# Patient Record
Sex: Female | Born: 1975 | Race: Black or African American | Hispanic: No | Marital: Married | Smoking: Never smoker
Health system: Southern US, Community
[De-identification: ages and names within clinical notes are randomized; demographics above are authoritative.]

## PROBLEM LIST (undated history)

## (undated) DIAGNOSIS — K219 Gastro-esophageal reflux disease without esophagitis: Secondary | ICD-10-CM

## (undated) DIAGNOSIS — E119 Type 2 diabetes mellitus without complications: Secondary | ICD-10-CM

## (undated) DIAGNOSIS — R011 Cardiac murmur, unspecified: Secondary | ICD-10-CM

## (undated) DIAGNOSIS — T4145XA Adverse effect of unspecified anesthetic, initial encounter: Secondary | ICD-10-CM

## (undated) DIAGNOSIS — N939 Abnormal uterine and vaginal bleeding, unspecified: Secondary | ICD-10-CM

## (undated) DIAGNOSIS — T8859XA Other complications of anesthesia, initial encounter: Secondary | ICD-10-CM

## (undated) DIAGNOSIS — D649 Anemia, unspecified: Secondary | ICD-10-CM

## (undated) DIAGNOSIS — D219 Benign neoplasm of connective and other soft tissue, unspecified: Secondary | ICD-10-CM

## (undated) DIAGNOSIS — M069 Rheumatoid arthritis, unspecified: Secondary | ICD-10-CM

## (undated) HISTORY — PX: ABDOMINAL HYSTERECTOMY: SHX81

## (undated) HISTORY — PX: BIOPSY BREAST: PRO8

## (undated) HISTORY — PX: TUBAL LIGATION: SHX77

---

## 2009-09-17 ENCOUNTER — Emergency Department (HOSPITAL_COMMUNITY): Admission: EM | Admit: 2009-09-17 | Discharge: 2009-09-17 | Payer: Self-pay | Admitting: Family Medicine

## 2010-12-16 ENCOUNTER — Emergency Department (HOSPITAL_COMMUNITY): Payer: Self-pay

## 2010-12-16 ENCOUNTER — Emergency Department (HOSPITAL_COMMUNITY)
Admission: EM | Admit: 2010-12-16 | Discharge: 2010-12-16 | Disposition: A | Discharge status: Home / Self Care | Payer: Self-pay | Attending: Emergency Medicine | Admitting: Emergency Medicine

## 2010-12-16 DIAGNOSIS — D649 Anemia, unspecified: Secondary | ICD-10-CM | POA: Insufficient documentation

## 2010-12-16 DIAGNOSIS — N898 Other specified noninflammatory disorders of vagina: Secondary | ICD-10-CM | POA: Insufficient documentation

## 2010-12-16 DIAGNOSIS — E119 Type 2 diabetes mellitus without complications: Secondary | ICD-10-CM | POA: Insufficient documentation

## 2010-12-16 LAB — DIFFERENTIAL
Basophils Absolute: 0 10*3/uL (ref 0.0–0.1)
Basophils Relative: 1 % (ref 0–1)
Lymphocytes Relative: 43 % (ref 12–46)
Neutro Abs: 3 10*3/uL (ref 1.7–7.7)
Neutrophils Relative %: 47 % (ref 43–77)

## 2010-12-16 LAB — WET PREP, GENITAL
Trich, Wet Prep: NONE SEEN
WBC, Wet Prep HPF POC: NONE SEEN

## 2010-12-16 LAB — CBC
HCT: 32.4 % — ABNORMAL LOW (ref 36.0–46.0)
Hemoglobin: 10 g/dL — ABNORMAL LOW (ref 12.0–15.0)
MCHC: 30.9 g/dL (ref 30.0–36.0)
RBC: 4.25 MIL/uL (ref 3.87–5.11)
WBC: 6.3 10*3/uL (ref 4.0–10.5)

## 2010-12-16 LAB — URINE MICROSCOPIC-ADD ON

## 2010-12-16 LAB — BASIC METABOLIC PANEL
BUN: 10 mg/dL (ref 6–23)
CO2: 26 mEq/L (ref 19–32)
Chloride: 102 mEq/L (ref 96–112)
GFR calc non Af Amer: >60 mL/min (ref >60)
Glucose, Bld: 99 mg/dL (ref 70–99)
Potassium: 3.9 mEq/L (ref 3.5–5.1)
Sodium: 137 mEq/L (ref 135–145)

## 2010-12-16 LAB — URINALYSIS, ROUTINE W REFLEX MICROSCOPIC
Bilirubin Urine: NEGATIVE
Ketones, ur: NEGATIVE mg/dL
Nitrite: NEGATIVE
Specific Gravity, Urine: 1.023 (ref 1.005–1.030)
pH: 6.5 (ref 5.0–8.0)

## 2010-12-16 LAB — POCT PREGNANCY, URINE: Preg Test, Ur: NEGATIVE

## 2010-12-17 LAB — GC/CHLAMYDIA PROBE AMP, GENITAL: Chlamydia, DNA Probe: NEGATIVE

## 2011-01-24 ENCOUNTER — Inpatient Hospital Stay (HOSPITAL_COMMUNITY)
Admission: AD | Admit: 2011-01-24 | Discharge: 2011-01-24 | Disposition: A | Discharge status: Home / Self Care | Payer: Self-pay | Source: Ambulatory Visit | Attending: Obstetrics & Gynecology | Admitting: Obstetrics & Gynecology

## 2011-01-24 ENCOUNTER — Inpatient Hospital Stay (HOSPITAL_COMMUNITY): Payer: Self-pay

## 2011-01-24 ENCOUNTER — Encounter (HOSPITAL_COMMUNITY): Payer: Self-pay | Admitting: *Deleted

## 2011-01-24 DIAGNOSIS — N949 Unspecified condition associated with female genital organs and menstrual cycle: Secondary | ICD-10-CM | POA: Insufficient documentation

## 2011-01-24 DIAGNOSIS — R9389 Abnormal findings on diagnostic imaging of other specified body structures: Secondary | ICD-10-CM | POA: Diagnosis present

## 2011-01-24 DIAGNOSIS — N938 Other specified abnormal uterine and vaginal bleeding: Secondary | ICD-10-CM | POA: Insufficient documentation

## 2011-01-24 HISTORY — DX: Anemia, unspecified: D64.9

## 2011-01-24 HISTORY — DX: Type 2 diabetes mellitus without complications: E11.9

## 2011-01-24 LAB — CBC
HCT: 29.3 % — ABNORMAL LOW (ref 36.0–46.0)
MCHC: 30.4 g/dL (ref 30.0–36.0)
Platelets: 417 10*3/uL — ABNORMAL HIGH (ref 150–400)
RDW: 16.5 % — ABNORMAL HIGH (ref 11.5–15.5)

## 2011-01-24 LAB — URINE MICROSCOPIC-ADD ON

## 2011-01-24 LAB — URINALYSIS, ROUTINE W REFLEX MICROSCOPIC
Nitrite: POSITIVE — AB
Protein, ur: 100 mg/dL — AB
Urobilinogen, UA: 1 mg/dL (ref 0.0–1.0)

## 2011-01-24 LAB — WET PREP, GENITAL: WBC, Wet Prep HPF POC: NONE SEEN

## 2011-01-24 LAB — POCT PREGNANCY, URINE: Preg Test, Ur: NEGATIVE

## 2011-01-24 MED ORDER — MEGESTROL ACETATE 40 MG PO TABS: 40.0000 mg | ORAL_TABLET | Freq: Every day | ORAL | Status: AC

## 2011-01-24 NOTE — ED Provider Notes (Signed)
History     Chief Complaint  Patient presents with  . Vaginal Bleeding   HPI Pt is not pregnant and complains of heavy vaginal bleeding since June12.  The first week it was light then has gotten heavier.  She has been passing fist size clots.  She has been wearing double pads changing 2 to 3 times daily.  She was seen at [facility] on 6/25 for initial evaluation.  Today she has been feeling more light heavier.  She had missed a period for 3 months then started bleeding and has not stopped.  She has gained weight.  She was told she was diabetic and was put on MEtformin.   Past Medical History  Diagnosis Date  . DM II (diabetes mellitus, type II), controlled   . Anemia     Past Surgical History  Procedure Date  . Tubal ligation   . Biopsy breast     benign     No family history on file.  History  Substance Use Topics  . Smoking status: Never Smoker   . Smokeless tobacco: Not on file  . Alcohol Use: No    Allergies: Allergies not on file  No prescriptions prior to admission    ROS Physical Exam   Blood pressure 158/96, pulse 103, temperature 98.6 F (37 C), temperature source Oral, resp. rate 20, height 5\' 3"  (1.6 m), weight 349 lb 12.8 oz (158.668 kg), last menstrual period 12/03/2010, SpO2 99.00%.  Physical Exam  Vitals reviewed. Constitutional: She is oriented to person, place, and time. She appears well-developed and well-nourished.       massively obese  HENT:  Head: Normocephalic.  Eyes: Pupils are equal, round, and reactive to light.  Neck: Normal range of motion. Neck supple.  Respiratory: Effort normal.  GI: Soft.  Genitourinary:       Small to mod amount of bright red blood in vault; uterus and adnexa mildly tender- size unable to appreciate due to habitus  Musculoskeletal: Normal range of motion.  Neurological: She is alert and oriented to person, place, and time.  Skin: Skin is warm and dry.  Psychiatric: She has a normal mood and affect.    MAU Course    Procedurespelvic exam with wet prep ande GC/Chlamdyia performed Ultrasound ordered- previous ultrasoundfrom June  showed endometrium 2.2cm and pt was to have re-eval in 4 to 6 weeks. MDM   Assessment and Plan    LINEBERRY,SUSAN 01/24/2011, 7:45 PM   US Transvaginal Non-ob  01/24/2011  *RADIOLOGY REPORT*  Clinical Data: Abnormal uterine bleeding  TRANSABDOMINAL AND TRANSVAGINAL ULTRASOUND OF PELVIS Technique:  Both transabdominal and transvaginal ultrasound examinations of the pelvis were performed. Transabdominal technique was performed for global imaging of the pelvis including uterus, ovaries, adnexal regions, and pelvic cul-de-sac.  Comparison: 12/16/2010  It was necessary to proceed with endovaginal exam following the transabdominal exam to better visualize the endometrium.  Findings:  Uterus: Normal in size.  2.5 x 2.1 x 1.6 cm intramural right uterine body fibroid.  Endometrium: Heterogeneous, thickened, measuring up to 2.3 cm.  No focal lesion.  Right ovary:  Not visualized.  Left ovary: Measures 4.0 x 2.2 x 2.6 cm and is notable for a 3.4 x 2.1 x 1.4 cm cyst/follicle.  Other findings: No free fluid  IMPRESSION: Heterogeneous, thickened endometrium measuring up to 2.3 cm.  No focal lesion.  Sonohysterogram or hysteroscopic evaluation should be considered.  3.4 cm left ovarian cyst / follicle.  Right ovary is not visualized.  Original  Report Authenticated By: Charline Bills, M.D.    A/P: 35 y.o. [id number] with DUB, thickened endometrium Rx megace F/U in GYN clinic Rev'd precautions Consult with Dr. Despina Hidden, agrees with plan

## 2011-01-24 NOTE — Progress Notes (Signed)
Pt states she has had vaginal bleeding every day since 6-12. Is heavy at times with fist size clots. Has some cramping off and on. Has been feeling weak today. Was seen at [facility] ED about 3 weeks ago. Was given Flagyl for bacteria.

## 2011-01-25 LAB — GC/CHLAMYDIA PROBE AMP, GENITAL: GC Probe Amp, Genital: NEGATIVE

## 2011-04-27 ENCOUNTER — Emergency Department (INDEPENDENT_AMBULATORY_CARE_PROVIDER_SITE_OTHER)
Admission: EM | Admit: 2011-04-27 | Discharge: 2011-04-27 | Disposition: A | Discharge status: Home / Self Care | Payer: Worker's Compensation | Source: Home / Self Care | Attending: Family Medicine | Admitting: Family Medicine

## 2011-04-27 ENCOUNTER — Encounter (HOSPITAL_COMMUNITY): Payer: Self-pay | Admitting: *Deleted

## 2011-04-27 DIAGNOSIS — S39012A Strain of muscle, fascia and tendon of lower back, initial encounter: Secondary | ICD-10-CM

## 2011-04-27 DIAGNOSIS — S335XXA Sprain of ligaments of lumbar spine, initial encounter: Secondary | ICD-10-CM

## 2011-04-27 DIAGNOSIS — IMO0002 Reserved for concepts with insufficient information to code with codable children: Secondary | ICD-10-CM

## 2011-04-27 DIAGNOSIS — S46911A Strain of unspecified muscle, fascia and tendon at shoulder and upper arm level, right arm, initial encounter: Secondary | ICD-10-CM

## 2011-04-27 MED ORDER — CYCLOBENZAPRINE HCL 10 MG PO TABS: 10.0000 mg | ORAL_TABLET | Freq: Three times a day (TID) | ORAL | Status: AC | PRN

## 2011-04-27 MED ORDER — HYDROCODONE-ACETAMINOPHEN 5-325 MG PO TABS: 1.0000 | ORAL_TABLET | Freq: Three times a day (TID) | ORAL | Status: AC

## 2011-04-27 NOTE — ED Provider Notes (Addendum)
History     CSN: [id number] Arrival date & time: 04/27/2011  5:53 PM   First MD Initiated Contact with Patient 04/27/11 1831      Chief Complaint  Patient presents with  . Back Pain    co pain starting today after moving patient from bed to stretcher at work, pain worse when raising right arm, co right shoulder pain and low back pain, has taken tylenol    (Consider location/radiation/quality/duration/timing/severity/associated sxs/prior treatment) Patient is a 35 y.o. female presenting with back pain. The history is provided by the patient.  Back Pain  This is a new problem. The current episode started 3 to 5 hours ago. The problem occurs constantly. The problem has been gradually worsening. 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 The pain is present in the lumbar spine. The quality of the pain is described as stabbing. The pain does not radiate. The pain is at a severity of 7/10. The symptoms are aggravated by bending and twisting. Pertinent negatives include no chest pain, no numbness, no abdominal pain, no paresthesias, no tingling and no weakness. Treatments tried: TYLENOL. The treatment provided no relief. Risk factors include obesity (DIABETES).    Past Medical History  Diagnosis Date  . DM II (diabetes mellitus, type II), controlled   . Anemia     Past Surgical History  Procedure Date  . Tubal ligation   . Biopsy breast     benign     History reviewed. No pertinent family history.  History  Substance Use Topics  . Smoking status: Never Smoker   . Smokeless tobacco: Not on file  . Alcohol Use: No    OB History    Grav Para Term Preterm Abortions TAB SAB Ect Mult Living   8 4 4  4 4    4       Review of Systems  Constitutional: Negative.   HENT: Negative.   Respiratory: Negative.   Cardiovascular: Negative.  Negative for chest pain.  Gastrointestinal: Negative.  Negative for abdominal pain.  Genitourinary: Negative.     Musculoskeletal: Positive for back pain.  Neurological: Negative for tingling, weakness, numbness and paresthesias.    Allergies  Review of patient's allergies indicates no known allergies.  Home Medications   Current Outpatient Rx  Name Route Sig Dispense Refill  . MEGESTROL ACETATE 40 MG PO TABS Oral Take 40 mg by mouth daily.      . CYCLOBENZAPRINE HCL 10 MG PO TABS Oral Take 1 tablet (10 mg total) by mouth 3 (three) times daily as needed for muscle spasms. 15 tablet 0  . HYDROCODONE-ACETAMINOPHEN 5-325 MG PO TABS Oral Take 1 tablet by mouth every 8 (eight) hours. 12 tablet 0    BP 142/66  Pulse 90  Temp(Src) 98.8 F (37.1 C) (Oral)  Resp 16  SpO2 100%  Physical Exam  Constitutional: She appears well-developed and well-nourished.       OBESE  Cardiovascular: Normal rate and regular rhythm.   Pulmonary/Chest: Breath sounds normal.  Abdominal: Soft. There is no tenderness.  Musculoskeletal:       PAIN AT THE L/S JUNCTION. PAIN WITH FLEXION TO 45 DEGREES, PAIN WITH SIDE BENDING. VERY LITTLE PAIN WITH ROTATION. HEEL TOE WALK INTACT. NEG SLR. SENSORY INTACT. DTR SYMETRICAL. NO DEFORMITY OR SWELLING. NO SKIN CHANGES. PAIN TO PALPATION RIGHT POST SHOULDER. ROM INTACT BUT PAINFUL. SKIN CLEAR, N/V INTACT DISTALLY.     ED Course  Procedures (including critical care time)  Labs Reviewed - No data to display No results found.   1. Lumbar strain   2. Right shoulder strain       MDM          Randa Spike, MD 04/27/11 1856  Randa Spike, MD 04/27/11 1857  Randa Spike, MD 04/27/11 2117

## 2011-05-19 ENCOUNTER — Encounter: Payer: Self-pay | Admitting: Advanced Practice Midwife

## 2011-07-24 ENCOUNTER — Encounter (HOSPITAL_COMMUNITY): Payer: Self-pay | Admitting: *Deleted

## 2011-07-24 ENCOUNTER — Inpatient Hospital Stay (HOSPITAL_COMMUNITY)
Admission: AD | Admit: 2011-07-24 | Discharge: 2011-07-24 | Disposition: A | Discharge status: Home / Self Care | Payer: 59 | Source: Ambulatory Visit | Attending: Obstetrics and Gynecology | Admitting: Obstetrics and Gynecology

## 2011-07-24 ENCOUNTER — Inpatient Hospital Stay (HOSPITAL_COMMUNITY): Payer: 59

## 2011-07-24 DIAGNOSIS — N926 Irregular menstruation, unspecified: Secondary | ICD-10-CM

## 2011-07-24 DIAGNOSIS — N938 Other specified abnormal uterine and vaginal bleeding: Secondary | ICD-10-CM | POA: Insufficient documentation

## 2011-07-24 DIAGNOSIS — N949 Unspecified condition associated with female genital organs and menstrual cycle: Secondary | ICD-10-CM | POA: Insufficient documentation

## 2011-07-24 LAB — WET PREP, GENITAL
Clue Cells Wet Prep HPF POC: NONE SEEN
Trich, Wet Prep: NONE SEEN
Yeast Wet Prep HPF POC: NONE SEEN

## 2011-07-24 LAB — CBC
Hemoglobin: 9.7 g/dL — ABNORMAL LOW (ref 12.0–15.0)
MCH: 22.9 pg — ABNORMAL LOW (ref 26.0–34.0)
Platelets: 410 10*3/uL — ABNORMAL HIGH (ref 150–400)
RBC: 4.24 MIL/uL (ref 3.87–5.11)
WBC: 10.7 10*3/uL — ABNORMAL HIGH (ref 4.0–10.5)

## 2011-07-24 MED ORDER — MEGESTROL ACETATE 40 MG PO TABS: 40.0000 mg | ORAL_TABLET | Freq: Every day | ORAL | Status: DC

## 2011-07-24 MED ORDER — KETOROLAC TROMETHAMINE 60 MG/2ML IM SOLN
60.0000 mg | Freq: Once | INTRAMUSCULAR | Status: AC
  Administered 2011-07-24: 60 mg via INTRAMUSCULAR
  Filled 2011-07-24: qty 2

## 2011-07-24 NOTE — Progress Notes (Addendum)
Pt in for abnormal bleeding.  Was seen here 6 months ago, given megace for bleeding.  Prescription ran out and bleeding started again on 07/09/11.  Reports passing clots from "walnut to softball size".  C/o lower abdominal cramping and pain in thighs.  Was supposed to be seen at [facility] clinic, unable to make scheduled appt.  Reports changing pad q6 hours.

## 2011-07-24 NOTE — Progress Notes (Signed)
Patient states she has had a history of irregular bleeding. States she was seen in MAU about 6 months ago and given Megace for six months. Finished the medication in early January and started bleeding on 1-16 and has continued to bleed every day, changes pads every 6 hours, and passes a lot of clots, some small and some large. Had an appointment with the [facility] but did not keep it.

## 2011-07-24 NOTE — ED Provider Notes (Signed)
History     CSN: [id number]  Arrival date & time 07/24/11  1032   None     Chief Complaint  Patient presents with  . Vaginal Bleeding  . Abdominal Pain     HPI Kaitlyn Rodgers is a very pleasant morbidly obese 36 y.o. female who presents to MAU for heavy vaginal bleeding. The bleeding started 07/09/11 and had some small clots but today passed large clots and had severe cramping pain. Was evaluated here about 6 months ago for similar problems and started on Megace. Stopped taking Megace the first week in Jan and now bleeding has returned. Nausea and headache. Was to follow up after last visit here with the GYN Clinic but had to work on the day she was scheduled so has not had follow up. The history was provided by the patient.  Past Medical History  Diagnosis Date  . DM II (diabetes mellitus, type II), controlled   . Anemia     Past Surgical History  Procedure Date  . Tubal ligation   . Biopsy breast     benign     Family History  Problem Relation Age of Onset  . Heart disease Mother     History  Substance Use Topics  . Smoking status: Never Smoker   . Smokeless tobacco: Not on file  . Alcohol Use: No    OB History    Grav Para Term Preterm Abortions TAB SAB Ect Mult Living   8 4 4  4 4    4       Review of Systems  Constitutional: Negative for fever, chills, diaphoresis and fatigue.  HENT: Negative for ear pain, congestion, sore throat, facial swelling, neck pain, neck stiffness, dental problem and sinus pressure.   Eyes: Negative for photophobia, pain and discharge.  Respiratory: Negative for cough, chest tightness and wheezing.   Cardiovascular: Negative.   Gastrointestinal: Positive for nausea and abdominal pain. Negative for vomiting, diarrhea, constipation and abdominal distention.  Genitourinary: Positive for frequency, vaginal bleeding and pelvic pain. Negative for dysuria, flank pain and difficulty urinating.  Musculoskeletal: Positive for back pain.  Negative for myalgias and gait problem.  Skin: Negative for color change and rash.  Neurological: Positive for light-headedness. Negative for dizziness, speech difficulty, weakness, numbness and headaches.  Psychiatric/Behavioral: Negative for confusion and agitation. The patient is not nervous/anxious.     Allergies  Review of patient's allergies indicates no known allergies.  Home Medications  No current outpatient prescriptions on file.  BP 129/67  Pulse 86  Temp(Src) 98.4 F (36.9 C) (Oral)  Resp 20  Ht 5\' 3"  (1.6 m)  Wt 355 lb 3.2 oz (161.118 kg)  BMI 62.92 kg/m2  SpO2 99%  LMP 07/09/2011  Physical Exam  Nursing note and vitals reviewed. Constitutional: She is oriented to person, place, and time. She appears well-developed and well-nourished.  HENT:  Head: Normocephalic.  Eyes: EOM are normal.  Neck: Neck supple.  Cardiovascular: Normal rate.   Pulmonary/Chest: Effort normal.  Abdominal: Soft. There is no tenderness.       Obese  Genitourinary:       External genitalia without lesions. Moderate blood in the vaginal vault. No CMT, mildly tender bilateral adnexa. Unable to palpate uterus due to patient habitus.  Musculoskeletal: Normal range of motion.  Neurological: She is alert and oriented to person, place, and time. No cranial nerve deficit.  Skin: Skin is warm and dry.  Psychiatric: She has a normal mood and affect. Her behavior  is normal. Judgment and thought content normal.   Results for orders placed during the hospital encounter of 07/24/11 (from the past 24 hour(s))  CBC     Status: Abnormal   Collection Time   07/24/11 12:00 PM      Component Value Range   WBC 10.7 (*) 4.0 - 10.5 (K/uL)   RBC 4.24  3.87 - 5.11 (MIL/uL)   Hemoglobin 9.7 (*) 12.0 - 15.0 (g/dL)   HCT 66.4 (*) 40.3 - 46.0 (%)   MCV 77.1 (*) 78.0 - 100.0 (fL)   MCH 22.9 (*) 26.0 - 34.0 (pg)   MCHC 29.7 (*) 30.0 - 36.0 (g/dL)   RDW 47.4 (*) 25.9 - 15.5 (%)   Platelets 410 (*) 150 - 400  (K/uL)  WET PREP, GENITAL     Status: Normal   Collection Time   07/24/11 12:42 PM      Component Value Range   Yeast Wet Prep HPF POC NONE SEEN  NONE SEEN    Trich, Wet Prep NONE SEEN  NONE SEEN    Clue Cells Wet Prep HPF POC NONE SEEN  NONE SEEN    WBC, Wet Prep HPF POC NONE SEEN  NONE SEEN    US Transvaginal Non-ob  07/24/2011  *RADIOLOGY REPORT*  Clinical Data: Abnormal bleeding with passage of large clots. Lower abdominal pain.  LMP 07/09/2011  TRANSABDOMINAL AND TRANSVAGINAL ULTRASOUND OF PELVIS Technique:  Both transabdominal and transvaginal ultrasound examinations of the pelvis were performed. Transabdominal technique was performed for global imaging of the pelvis including uterus, ovaries, adnexal regions, and pelvic cul-de-sac.  Comparison: 01/24/2011   It was necessary to proceed with endovaginal exam following the transabdominal exam to visualize the endometrium and myometrium.  Findings:  Uterus: Demonstrates a sagittal length of 12.2 cm, depth of 7.3 cm and transverse width of 8.5 cm.  Several focal fibroids are identified: one in the right anterior lower uterine segment measuring 3.0 x 3.5 x 3.0 cm and one in the left lateral lower uterine segment measuring 1.8 by 2.0 x 1.6 cm which has a partial subserosal component.  Endometrium: Is homogeneously echogenic with an AP width of 9.4 mm. No areas of focal thickening or heterogeneity are seen.  Right ovary:  Has a normal appearance measuring 2.5 x 1.4 x 2.1 cm  Left ovary: Has a normal appearance measuring 2.8 x 1.9 x 2.0 cm  Other findings: No pelvic fluid or separate adnexal masses are seen.  IMPRESSION: Fibroid uterus with fibroid sizes and locations as noted above. Normal endometrial lining and ovaries.  Original Report Authenticated By: Bertha Stakes, M.D.   US Pelvis Complete  07/24/2011  *RADIOLOGY REPORT*  Clinical Data: Abnormal bleeding with passage of large clots. Lower abdominal pain.  LMP 07/09/2011  TRANSABDOMINAL AND  TRANSVAGINAL ULTRASOUND OF PELVIS Technique:  Both transabdominal and transvaginal ultrasound examinations of the pelvis were performed. Transabdominal technique was performed for global imaging of the pelvis including uterus, ovaries, adnexal regions, and pelvic cul-de-sac.  Comparison: 01/24/2011   It was necessary to proceed with endovaginal exam following the transabdominal exam to visualize the endometrium and myometrium.  Findings:  Uterus: Demonstrates a sagittal length of 12.2 cm, depth of 7.3 cm and transverse width of 8.5 cm.  Several focal fibroids are identified: one in the right anterior lower uterine segment measuring 3.0 x 3.5 x 3.0 cm and one in the left lateral lower uterine segment measuring 1.8 by 2.0 x 1.6 cm which has a partial subserosal component.  Endometrium: Is homogeneously echogenic with an AP width of 9.4 mm. No areas of focal thickening or heterogeneity are seen.  Right ovary:  Has a normal appearance measuring 2.5 x 1.4 x 2.1 cm  Left ovary: Has a normal appearance measuring 2.8 x 1.9 x 2.0 cm  Other findings: No pelvic fluid or separate adnexal masses are seen.  IMPRESSION: Fibroid uterus with fibroid sizes and locations as noted above. Normal endometrial lining and ovaries.  Original Report Authenticated By: Bertha Stakes, M.D.    Assessment: Abnormal vaginal bleeding  Plan:  Megace 40 mg po daily Rx  ED Course: discussed with Dr. Jolayne Panther and will have patient start back on Megace and follow up in the  GYN clinic. Stressed importance to patient that Megace is only temporary and she will need follow up.  Procedures   MDM          Kerrie Buffalo, NP 07/24/11 1523

## 2011-07-25 LAB — GC/CHLAMYDIA PROBE AMP, GENITAL
Chlamydia, DNA Probe: NEGATIVE
GC Probe Amp, Genital: NEGATIVE

## 2011-07-25 NOTE — ED Provider Notes (Signed)
Agree with above note.  Bhumi Godbey 07/25/2011 5:50 AM   

## 2011-07-31 ENCOUNTER — Encounter: Payer: Self-pay | Admitting: Obstetrics & Gynecology

## 2011-07-31 ENCOUNTER — Ambulatory Visit (INDEPENDENT_AMBULATORY_CARE_PROVIDER_SITE_OTHER): Payer: 59 | Admitting: Advanced Practice Midwife

## 2011-07-31 ENCOUNTER — Encounter: Payer: Self-pay | Admitting: Advanced Practice Midwife

## 2011-07-31 ENCOUNTER — Other Ambulatory Visit (HOSPITAL_COMMUNITY)
Admission: RE | Admit: 2011-07-31 | Discharge: 2011-07-31 | Disposition: A | Discharge status: Home / Self Care | Payer: 59 | Source: Ambulatory Visit | Attending: Advanced Practice Midwife | Admitting: Advanced Practice Midwife

## 2011-07-31 DIAGNOSIS — N920 Excessive and frequent menstruation with regular cycle: Secondary | ICD-10-CM

## 2011-07-31 DIAGNOSIS — Z01812 Encounter for preprocedural laboratory examination: Secondary | ICD-10-CM

## 2011-07-31 DIAGNOSIS — D259 Leiomyoma of uterus, unspecified: Secondary | ICD-10-CM

## 2011-07-31 NOTE — Progress Notes (Signed)
  Endometrial Biopsy Procedure Note  Pre-operative Diagnosis: Menorrhagia, Fibroid Uterus  Post-operative Diagnosis: same  Indications: abnormal uterine bleeding  Procedure Details   Urine pregnancy test was done  and result was Negative.  The risks (including infection, bleeding, pain, and uterine perforation) and benefits of the procedure were explained to the patient and Verbal informed consent was obtained.  Antibiotic prophylaxis against endocarditis was not indicated.   The patient was placed in the dorsal lithotomy position.  Bimanual exam showed the uterus to be in the neutral position.  A Graves' speculum inserted in the vagina, and the cervix prepped with povidone iodine.  Endocervical curettage with a Kevorkian curette was performed. Cervix was difficult to locate due to habitus. Several methods were used and eventually successfully found it using a Long Graves.   A sharp tenaculum was applied to the anterior lip of the cervix for stabilization.  A sterile uterine sound was used to sound the uterus to a depth of 4cm.  A Pipelle endometrial aspirator was used to sample the endometrium.  Sample was sent for pathologic examination.  Condition: Stable  Complications: None  Plan:  The patient was advised to call for any fever or for prolonged or severe pain or bleeding. She was advised to use OTC ibuprofen as needed for mild to moderate pain. She was advised to avoid vaginal intercourse for 48 hours or until the bleeding has completely stopped.  We will bring her back in 2-3 weeks with MD to discuss options for treatment. I discussed hormonal options (patient adamantly refuses long term hormonal treatment), ablation and hysterectomy as last resort after other treatments failed.  Patient is very certain she wants a hysterectomy. I told her I cannot say for sure this would be recommended as first line therapy, so will have her consult with a doctor.

## 2011-07-31 NOTE — Patient Instructions (Signed)
Menorrhagia Dysfunctional uterine bleeding is different from a normal menstrual period. When periods are heavy or there is more bleeding than is usual for you, it is called menorrhagia. It may be caused by hormonal imbalance, or physical, metabolic, or other problems. Examination is necessary in order that your caregiver may treat treatable causes. If this is a continuing problem, a D&C may be needed. That means that the cervix (the opening of the uterus or womb) is dilated (stretched larger) and the lining of the uterus is scraped out. The tissue scraped out is then examined under a microscope by a specialist (pathologist) to make sure there is nothing of concern that needs further or more extensive treatment. HOME CARE INSTRUCTIONS   If medications were prescribed, take exactly as directed. Do not change or switch medications without consulting your caregiver.   Long term heavy bleeding may result in iron deficiency. Your caregiver may have prescribed iron pills. They help replace the iron your body lost from heavy bleeding. Take exactly as directed. Iron may cause constipation. If this becomes a problem, increase the bran, fruits, and roughage in your diet.   Do not take aspirin or medicines that contain aspirin one week before or during your menstrual period. Aspirin may make the bleeding worse.   If you need to change your sanitary pad or tampon more than once every 2 hours, stay in bed and rest as much as possible until the bleeding stops.   Eat well-balanced meals. Eat foods high in iron. Examples are leafy green vegetables, meat, liver, eggs, and whole grain breads and cereals. Do not try to lose weight until the abnormal bleeding has stopped and your blood iron level is back to normal.  SEEK MEDICAL CARE IF:   You need to change your sanitary pad or tampon more than once an hour.   You develop nausea (feeling sick to your stomach) and vomiting, dizziness, or diarrhea while you are taking  your medicine.   You have any problems that may be related to the medicine you are taking.  SEEK IMMEDIATE MEDICAL CARE IF:   You have a fever.   You develop chills.   You develop severe bleeding or start to pass blood clots.   You feel dizzy or faint.  MAKE SURE YOU:   Understand these instructions.   Will watch your condition.   Will get help right away if you are not doing well or get worse.  Document Released: 06/09/2005 Document Revised: 02/19/2011 Document Reviewed: 01/28/2008 [facility] Patient Information 2012 [place], [place].Endometrial Biopsy This is a test in which a tissue sample (a biopsy) is taken from inside the uterus (womb). It is then looked at by a specialist under a microscope to see if the tissue is normal or abnormal. The endometrium is the lining of the uterus. This test helps determine where you are in your menstrual cycle and how hormone levels are affecting the lining of the uterus. Another use for this test is to diagnose endometrial cancer, tuberculosis, polyps, or inflammatory conditions and to evaluate uterine bleeding. PREPARATION FOR TEST No preparation or fasting is necessary. NORMAL FINDINGS No pathologic conditions. Presence of "secretory-type" endometrium 3 to 5 days before to normal menstruation. Ranges for normal findings may vary among different laboratories and hospitals. You should always check with your doctor after having lab work or other tests done to discuss the meaning of your test results and whether your values are considered within normal limits. MEANING OF TEST  Your caregiver  will go over the test results with you and discuss the importance and meaning of your results, as well as treatment options and the need for additional tests if necessary. OBTAINING THE TEST RESULTS It is your responsibility to obtain your test results. Ask the lab or department performing the test when and how you will get your results. Document Released:  10/10/2004 Document Revised: 02/19/2011 Document Reviewed: 05/19/2008 [facility] Patient Information 2012 [place], [place].

## 2011-08-22 ENCOUNTER — Ambulatory Visit (INDEPENDENT_AMBULATORY_CARE_PROVIDER_SITE_OTHER): Payer: 59 | Admitting: Obstetrics & Gynecology

## 2011-08-22 DIAGNOSIS — N920 Excessive and frequent menstruation with regular cycle: Secondary | ICD-10-CM

## 2011-08-22 DIAGNOSIS — Z0000 Encounter for general adult medical examination without abnormal findings: Secondary | ICD-10-CM

## 2011-08-22 DIAGNOSIS — Z23 Encounter for immunization: Secondary | ICD-10-CM

## 2011-08-22 MED ORDER — INFLUENZA VIRUS VACC SPLIT PF IM SUSP
0.5000 mL | INTRAMUSCULAR | Status: AC
  Administered 2011-08-22: 0.5 mL via INTRAMUSCULAR

## 2011-08-22 MED ORDER — MEGESTROL ACETATE 40 MG PO TABS: 40.0000 mg | ORAL_TABLET | Freq: Every day | ORAL | Status: AC

## 2011-08-22 NOTE — Progress Notes (Signed)
  Subjective:    Patient ID: Kaitlyn Rodgers, female    DOB: December 08, 1975, 36 y.o.   MRN: [id number]  HPI  Ms. Adah Salvage is a 36 yo very pleasant morbidly obese AA lady who would like a hysterectomy ASAP. She has a large fibroid uterus and menorrhagia resulting in anemia. She is on Megace to try to help the bleeding, but she is still bleeding. She has been experiencing dysparunia recently as well.  Review of Systems She wants a flu shot today, Her pap is due    Objective:   Physical Exam  14 week size uterus, mobile, good pubic arch Pap smear done, normal appearing cervix      Assessment & Plan:  Fibroids, menorrhagia- I have given her written info on RATH and this is the route she would like. I have discussed risks of surgery.

## 2011-08-23 LAB — TSH: TSH: 2.54 u[IU]/mL (ref 0.350–4.500)

## 2011-09-03 ENCOUNTER — Encounter (HOSPITAL_COMMUNITY): Payer: Self-pay | Admitting: Emergency Medicine

## 2011-09-03 ENCOUNTER — Emergency Department (INDEPENDENT_AMBULATORY_CARE_PROVIDER_SITE_OTHER)
Admission: EM | Admit: 2011-09-03 | Discharge: 2011-09-03 | Disposition: A | Discharge status: Home Health | Payer: 59 | Source: Home / Self Care | Attending: Emergency Medicine | Admitting: Emergency Medicine

## 2011-09-03 DIAGNOSIS — J329 Chronic sinusitis, unspecified: Secondary | ICD-10-CM

## 2011-09-03 DIAGNOSIS — J683 Other acute and subacute respiratory conditions due to chemicals, gases, fumes and vapors: Secondary | ICD-10-CM

## 2011-09-03 DIAGNOSIS — J45909 Unspecified asthma, uncomplicated: Secondary | ICD-10-CM

## 2011-09-03 HISTORY — DX: Abnormal uterine and vaginal bleeding, unspecified: N93.9

## 2011-09-03 HISTORY — DX: Benign neoplasm of connective and other soft tissue, unspecified: D21.9

## 2011-09-03 MED ORDER — DOXYCYCLINE HYCLATE 100 MG PO CAPS: 100.0000 mg | ORAL_CAPSULE | Freq: Two times a day (BID) | ORAL | Status: AC

## 2011-09-03 MED ORDER — HYDROCODONE-ACETAMINOPHEN 7.5-500 MG/15ML PO SOLN: 5.0000 mL | Freq: Four times a day (QID) | ORAL | Status: AC | PRN

## 2011-09-03 MED ORDER — PSEUDOEPHEDRINE-GUAIFENESIN ER 120-1200 MG PO TB12: 1.0000 | ORAL_TABLET | Freq: Two times a day (BID) | ORAL | Status: DC | PRN

## 2011-09-03 MED ORDER — FLUTICASONE PROPIONATE 50 MCG/ACT NA SUSP: 2.0000 | Freq: Every day | NASAL | Status: AC

## 2011-09-03 MED ORDER — ALBUTEROL SULFATE HFA 108 (90 BASE) MCG/ACT IN AERS: 1.0000 | INHALATION_SPRAY | Freq: Four times a day (QID) | RESPIRATORY_TRACT | Status: DC | PRN

## 2011-09-03 NOTE — ED Notes (Addendum)
PT HERE WITH SINUS SX THAT STARTED LAST WEEK WITH CONGESTION,SNEEZING AND HEAD PRESSURE ON BOTH SIDES BUT HAS WORSENED YESTERDAY WITH CHEST PRESSURE AND PAIN DEEP BREATHING,SOB AND FATIGUE.TEMP OVER THE WEEKEND 102.3.PT HAS BEEN TAKING OTC COLD MEDS AND MUCINEX.

## 2011-09-03 NOTE — ED Provider Notes (Signed)
History     CSN: [id number]  Arrival date & time 09/03/11  9528   First MD Initiated Contact with Patient 09/03/11 0902      Chief Complaint  Patient presents with  . Sinusitis  . URI    (Consider location/radiation/quality/duration/timing/severity/associated sxs/prior treatment) HPI Comments: Pt with nasal congestion, postnasal drip, ST, nonproductive cough, bilateral frontal sinus pain/pressure worse with bending forward  x 7 days. Reports fevers Tmax 102.3, fatigue. Reports some chest tightness, mild shortness of breath when talking or with exertion. No chest pain, wheezing. Is unable to sleep at night because of all the coughing. No ear pain, N/V, other HA, bodyaches,dental pain, purulent nasal d/c. Taking otc cold meds including Mucinex DM w/o relief. No known sick contacts.  has a history of sinus infections. No history of asthma, COPD, pneumonia, CAD. Patient states that her glucose has been running within normal limits for her.  ROS as noted in HPI. All other ROS negative.   Patient is a 36 y.o. female presenting with sinusitis. The history is provided by the patient. No language interpreter was used.  Sinusitis  This is a new problem. The current episode started more than 2 days ago. The problem has been gradually worsening. The maximum temperature recorded prior to her arrival was 102 to 102.9 F. The fever has been present for 1 to 2 days. Associated symptoms include congestion and sinus pressure. She has tried other medications for the symptoms.    Past Medical History  Diagnosis Date  . DM II (diabetes mellitus, type II), controlled   . Anemia   . Abnormal vaginal bleeding   . Fibroids     Past Surgical History  Procedure Date  . Tubal ligation   . Biopsy breast     benign     Family History  Problem Relation Age of Onset  . Heart disease Mother   . Diabetes Maternal Grandmother     History  Substance Use Topics  . Smoking status: Never Smoker   .  Smokeless tobacco: Never Used  . Alcohol Use: No    OB History    Grav Para Term Preterm Abortions TAB SAB Ect Mult Living   5 4 4  1 1    4       Review of Systems  HENT: Positive for congestion and sinus pressure.     Allergies  Review of patient's allergies indicates no known allergies.  Home Medications   Current Outpatient Rx  Name Route Sig Dispense Refill  . ALBUTEROL SULFATE HFA 108 (90 BASE) MCG/ACT IN AERS Inhalation Inhale 1-2 puffs into the lungs every 6 (six) hours as needed for wheezing. 1 Inhaler 0  . DOXYCYCLINE HYCLATE 100 MG PO CAPS Oral Take 1 capsule (100 mg total) by mouth 2 (two) times daily. X 7 days 14 capsule 0  . OMEGA-3 FATTY ACIDS 1000 MG PO CAPS Oral Take 2 g by mouth daily.    Marland Kitchen FLUTICASONE PROPIONATE 50 MCG/ACT NA SUSP Nasal Place 2 sprays into the nose daily. 16 g 2  . HYDROCODONE-ACETAMINOPHEN 7.5-500 MG/15ML PO SOLN Oral Take 5 mLs by mouth every 6 (six) hours as needed for pain. 120 mL 0  . MEGESTROL ACETATE 40 MG PO TABS Oral Take 1 tablet (40 mg total) by mouth daily. 30 tablet 2  . METFORMIN HCL 500 MG PO TABS Oral Take 500 mg by mouth daily.    . MULTI-VITAMIN/MINERALS PO TABS Oral Take 1 tablet by mouth daily.    Marland Kitchen  PSEUDOEPHEDRINE-GUAIFENESIN ER [id number] MG PO TB12 Oral Take 1 tablet by mouth 2 (two) times daily as needed (congestion). 20 each 0    BP 127/68  Pulse 87  Temp(Src) 98.3 F (36.8 C) (Oral)  SpO2 97%  LMP 07/05/2011  Physical Exam  Nursing note and vitals reviewed. Constitutional: She is oriented to person, place, and time. She appears well-developed and well-nourished.  HENT:  Head: Normocephalic and atraumatic.  Right Ear: Tympanic membrane and ear canal normal.  Left Ear: Tympanic membrane and ear canal normal.  Nose: Mucosal edema and rhinorrhea present. No epistaxis.  Mouth/Throat: Uvula is midline and mucous membranes are normal. Posterior oropharyngeal erythema present. No oropharyngeal exudate.       Purulent  nasal discharge. (-) frontal, maxillary sinus tenderness  Eyes: Conjunctivae and EOM are normal. Pupils are equal, round, and reactive to light.  Neck: Normal range of motion. Neck supple.  Cardiovascular: Normal rate, regular rhythm and normal heart sounds.   Pulmonary/Chest: Effort normal and breath sounds normal.  Abdominal: Soft. Bowel sounds are normal.  Musculoskeletal: Normal range of motion.  Lymphadenopathy:    She has no cervical adenopathy.  Neurological: She is alert and oriented to person, place, and time.  Skin: Skin is warm and dry. No rash noted.  Psychiatric: She has a normal mood and affect. Her behavior is normal. Judgment and thought content normal.    ED Course  Procedures (including critical care time)  Labs Reviewed - No data to display No results found.   1. Sinusitis   2. Reactive airways dysfunction syndrome     MDM  Pt with fever 102.3, Will start  doxycycline in addition to flonase, mucinex-d, saline nasal irrigation, increase fluids, tylenol/motrin prn pain. Doxycycline will also cover any possible pneumonia, however, patient's lungs are clear and is satting 97% room-air. Discussed MDM and plan with pt. Pt agrees with plan and will f/u with PMD prn.    Domenick Gong, MD 09/03/11 1650

## 2011-09-03 NOTE — Discharge Instructions (Signed)
Take two puffs from your inhaler every 4-6 hours for the first day, then as needed. Take the medication as written You may take 600 mg of motrin with 1 gram of tylenol up to 4 times a day as needed for pain. This is an effective combination for pain.  Most sinus infections are viral and do not need antibiotics unless you have a high fever, have had this for 10 day, or you get better and then get sick again. Use a neti pot or the NeilMed sinus rinse as often as you want to to reduce nasal congestion. Follow the directions on the box. . Return if you get worse, have a fever >100.4, or for any concerns.  Go to www.goodrx.com to look up your medications. This will give you a list of where you can find your prescriptions at the most affordable prices.

## 2011-09-16 ENCOUNTER — Encounter: Payer: Self-pay | Admitting: Obstetrics & Gynecology

## 2011-10-06 ENCOUNTER — Encounter (HOSPITAL_COMMUNITY)
Admission: RE | Admit: 2011-10-06 | Discharge: 2011-10-06 | Disposition: A | Discharge status: Home / Self Care | Payer: 59 | Source: Ambulatory Visit | Attending: Obstetrics & Gynecology | Admitting: Obstetrics & Gynecology

## 2011-10-06 ENCOUNTER — Encounter (HOSPITAL_COMMUNITY): Payer: Self-pay

## 2011-10-06 HISTORY — DX: Cardiac murmur, unspecified: R01.1

## 2011-10-06 HISTORY — DX: Adverse effect of unspecified anesthetic, initial encounter: T41.45XA

## 2011-10-06 HISTORY — DX: Gastro-esophageal reflux disease without esophagitis: K21.9

## 2011-10-06 HISTORY — DX: Other complications of anesthesia, initial encounter: T88.59XA

## 2011-10-06 LAB — SURGICAL PCR SCREEN: Staphylococcus aureus: NEGATIVE

## 2011-10-06 LAB — BASIC METABOLIC PANEL
BUN: 11 mg/dL (ref 6–23)
CO2: 25 mEq/L (ref 19–32)
Calcium: 9.9 mg/dL (ref 8.4–10.5)
Chloride: 103 mEq/L (ref 96–112)
Creatinine, Ser: 0.61 mg/dL (ref 0.50–1.10)
Glucose, Bld: 114 mg/dL — ABNORMAL HIGH (ref 70–99)

## 2011-10-06 LAB — CBC
HCT: 35.5 % — ABNORMAL LOW (ref 36.0–46.0)
Hemoglobin: 10.6 g/dL — ABNORMAL LOW (ref 12.0–15.0)
MCH: 23.4 pg — ABNORMAL LOW (ref 26.0–34.0)
MCHC: 29.9 g/dL — ABNORMAL LOW (ref 30.0–36.0)
MCV: 78.4 fL (ref 78.0–100.0)
RDW: 16.4 % — ABNORMAL HIGH (ref 11.5–15.5)

## 2011-10-06 NOTE — Patient Instructions (Addendum)
   Your procedure is scheduled on: Monday April 22nd  Enter through the Main Entrance of [facility] at:11:30am Pick up the phone at the desk and dial [phone] and inform us of your arrival.  Please call this number if you have any problems the morning of surgery: [phone]  Remember: Do not eat food after midnight: Sunday Do not drink clear liquids after:9am Monday Take these medicines the morning of surgery with a SIP OF WATER: none. Hold metformin morning of surgery. Bring your albuterol inhaler day of surgery  Do not wear jewelry, make-up, or FINGER nail polish Do not wear lotions, powders, perfumes or deodorant. Do not shave 48 hours prior to surgery. Do not bring valuables to the hospital. Contacts, dentures or bridgework may not be worn into surgery.  Leave suitcase in the car. After Surgery it may be brought to your room. For patients being admitted to the hospital, checkout time is 11:00am the day of discharge.  Patients discharged on the day of surgery will not be allowed to drive home.     Remember to use your hibiclens as instructed.Please shower with 1/2 bottle the evening before your surgery and the other 1/2 bottle the morning of surgery. Neck down avoiding private area.

## 2011-10-12 MED ORDER — CEFAZOLIN SODIUM-DEXTROSE 2-3 GM-% IV SOLR
2.0000 g | INTRAVENOUS | Status: AC
  Administered 2011-10-13: 2 g via INTRAVENOUS
  Filled 2011-10-12: qty 50

## 2011-10-13 ENCOUNTER — Encounter (HOSPITAL_COMMUNITY): Payer: Self-pay | Admitting: Certified Registered Nurse Anesthetist

## 2011-10-13 ENCOUNTER — Ambulatory Visit (HOSPITAL_COMMUNITY)
Admission: AD | Admit: 2011-10-13 | Discharge: 2011-10-13 | Disposition: A | Discharge status: Home / Self Care | Payer: 59 | Source: Ambulatory Visit | Attending: Obstetrics & Gynecology | Admitting: Obstetrics & Gynecology

## 2011-10-13 ENCOUNTER — Encounter (HOSPITAL_COMMUNITY)
Admission: AD | Disposition: A | Discharge status: Home / Self Care | Payer: Self-pay | Source: Ambulatory Visit | Attending: Obstetrics & Gynecology

## 2011-10-13 ENCOUNTER — Encounter (HOSPITAL_COMMUNITY): Payer: Self-pay | Admitting: Obstetrics & Gynecology

## 2011-10-13 ENCOUNTER — Ambulatory Visit (HOSPITAL_COMMUNITY): Payer: 59 | Admitting: Certified Registered Nurse Anesthetist

## 2011-10-13 DIAGNOSIS — N938 Other specified abnormal uterine and vaginal bleeding: Secondary | ICD-10-CM

## 2011-10-13 DIAGNOSIS — N920 Excessive and frequent menstruation with regular cycle: Secondary | ICD-10-CM

## 2011-10-13 DIAGNOSIS — D500 Iron deficiency anemia secondary to blood loss (chronic): Secondary | ICD-10-CM | POA: Insufficient documentation

## 2011-10-13 DIAGNOSIS — D259 Leiomyoma of uterus, unspecified: Secondary | ICD-10-CM

## 2011-10-13 HISTORY — PX: CYSTOSCOPY: SHX5120

## 2011-10-13 LAB — GLUCOSE, CAPILLARY
Glucose-Capillary: 119 mg/dL — ABNORMAL HIGH (ref 70–99)
Glucose-Capillary: 176 mg/dL — ABNORMAL HIGH (ref 70–99)

## 2011-10-13 LAB — PREGNANCY, URINE: Preg Test, Ur: NEGATIVE

## 2011-10-13 SURGERY — ROBOTIC ASSISTED TOTAL HYSTERECTOMY
Anesthesia: General | Site: Vagina | Wound class: Clean Contaminated

## 2011-10-13 MED ORDER — HYDROMORPHONE HCL PF 1 MG/ML IJ SOLN
0.2000 mg | INTRAMUSCULAR | Status: DC | PRN
  Administered 2011-10-13: 0.5 mg via INTRAVENOUS
  Filled 2011-10-13: qty 1

## 2011-10-13 MED ORDER — LACTATED RINGERS IR SOLN
Status: DC | PRN
  Administered 2011-10-13: 3000 mL

## 2011-10-13 MED ORDER — MEPERIDINE HCL 25 MG/ML IJ SOLN: 6.2500 mg | INTRAMUSCULAR | Status: DC | PRN

## 2011-10-13 MED ORDER — ONDANSETRON HCL 4 MG/2ML IJ SOLN
INTRAMUSCULAR | Status: DC | PRN
  Administered 2011-10-13 (×2): 2 mg via INTRAVENOUS

## 2011-10-13 MED ORDER — LACTATED RINGERS IV SOLN
INTRAVENOUS | Status: DC
  Administered 2011-10-13 (×2): via INTRAVENOUS

## 2011-10-13 MED ORDER — DEXTROSE IN LACTATED RINGERS 5 % IV SOLN: INTRAVENOUS | Status: DC

## 2011-10-13 MED ORDER — IBUPROFEN 800 MG PO TABS
800.0000 mg | ORAL_TABLET | Freq: Three times a day (TID) | ORAL | Status: DC | PRN
  Administered 2011-10-13: 800 mg via ORAL
  Filled 2011-10-13: qty 1

## 2011-10-13 MED ORDER — ONDANSETRON HCL 4 MG/2ML IJ SOLN
INTRAMUSCULAR | Status: AC
  Filled 2011-10-13: qty 2

## 2011-10-13 MED ORDER — PHENAZOPYRIDINE HCL 200 MG PO TABS
200.0000 mg | ORAL_TABLET | Freq: Once | ORAL | Status: AC
  Administered 2011-10-13: 200 mg via ORAL
  Filled 2011-10-13: qty 1

## 2011-10-13 MED ORDER — PNEUMOCOCCAL VAC POLYVALENT 25 MCG/0.5ML IJ INJ
0.5000 mL | INJECTION | INTRAMUSCULAR | Status: AC | PRN
  Administered 2011-10-13: 0.5 mL via INTRAMUSCULAR
  Filled 2011-10-13: qty 0.5

## 2011-10-13 MED ORDER — FENTANYL CITRATE 0.05 MG/ML IJ SOLN
25.0000 ug | INTRAMUSCULAR | Status: DC | PRN
  Administered 2011-10-13 (×2): 50 ug via INTRAVENOUS

## 2011-10-13 MED ORDER — ROCURONIUM BROMIDE 50 MG/5ML IV SOLN
INTRAVENOUS | Status: AC
  Filled 2011-10-13: qty 2

## 2011-10-13 MED ORDER — SCOPOLAMINE 1 MG/3DAYS TD PT72
MEDICATED_PATCH | TRANSDERMAL | Status: AC
  Administered 2011-10-13: 1.5 mg via TRANSDERMAL
  Filled 2011-10-13: qty 1

## 2011-10-13 MED ORDER — MIDAZOLAM HCL 5 MG/5ML IJ SOLN
INTRAMUSCULAR | Status: DC | PRN
  Administered 2011-10-13: .5 mg via INTRAVENOUS
  Administered 2011-10-13: 1.5 mg via INTRAVENOUS

## 2011-10-13 MED ORDER — NEOSTIGMINE METHYLSULFATE 1 MG/ML IJ SOLN
INTRAMUSCULAR | Status: AC
  Filled 2011-10-13: qty 10

## 2011-10-13 MED ORDER — LIDOCAINE HCL (CARDIAC) 20 MG/ML IV SOLN
INTRAVENOUS | Status: DC | PRN
  Administered 2011-10-13: 100 mg via INTRAVENOUS

## 2011-10-13 MED ORDER — PROPOFOL 10 MG/ML IV EMUL
INTRAVENOUS | Status: DC | PRN
  Administered 2011-10-13: 300 mg via INTRAVENOUS

## 2011-10-13 MED ORDER — INDIGOTINDISULFONATE SODIUM 8 MG/ML IJ SOLN
INTRAMUSCULAR | Status: DC | PRN
  Administered 2011-10-13: 40 mg via INTRAVENOUS

## 2011-10-13 MED ORDER — LIDOCAINE HCL (CARDIAC) 20 MG/ML IV SOLN
INTRAVENOUS | Status: AC
  Filled 2011-10-13: qty 5

## 2011-10-13 MED ORDER — NEOSTIGMINE METHYLSULFATE 1 MG/ML IJ SOLN
INTRAMUSCULAR | Status: DC | PRN
  Administered 2011-10-13: 5 mg via INTRAVENOUS

## 2011-10-13 MED ORDER — FENTANYL CITRATE 0.05 MG/ML IJ SOLN
INTRAMUSCULAR | Status: AC
  Administered 2011-10-13: 50 ug via INTRAVENOUS
  Filled 2011-10-13: qty 2

## 2011-10-13 MED ORDER — SODIUM CHLORIDE 0.9 % IJ SOLN
INTRAMUSCULAR | Status: DC | PRN
  Administered 2011-10-13: 60 mL via INTRAVENOUS

## 2011-10-13 MED ORDER — SUCCINYLCHOLINE CHLORIDE 20 MG/ML IJ SOLN
INTRAMUSCULAR | Status: DC | PRN
  Administered 2011-10-13: 140 mg via INTRAVENOUS

## 2011-10-13 MED ORDER — SCOPOLAMINE 1 MG/3DAYS TD PT72
1.0000 | MEDICATED_PATCH | Freq: Once | TRANSDERMAL | Status: AC
  Administered 2011-10-13: 1.5 mg via TRANSDERMAL
  Administered 2011-10-13: 1 via TRANSDERMAL

## 2011-10-13 MED ORDER — SIMETHICONE 80 MG PO CHEW: 80.0000 mg | CHEWABLE_TABLET | Freq: Four times a day (QID) | ORAL | Status: DC | PRN

## 2011-10-13 MED ORDER — OXYCODONE-ACETAMINOPHEN 5-325 MG PO TABS: 1.0000 | ORAL_TABLET | ORAL | Status: AC | PRN

## 2011-10-13 MED ORDER — IBUPROFEN 800 MG PO TABS: 800.0000 mg | ORAL_TABLET | Freq: Three times a day (TID) | ORAL | Status: AC | PRN

## 2011-10-13 MED ORDER — DEXAMETHASONE SODIUM PHOSPHATE 10 MG/ML IJ SOLN
INTRAMUSCULAR | Status: AC
  Filled 2011-10-13: qty 1

## 2011-10-13 MED ORDER — ROCURONIUM BROMIDE 100 MG/10ML IV SOLN
INTRAVENOUS | Status: DC | PRN
  Administered 2011-10-13: 50 mg via INTRAVENOUS
  Administered 2011-10-13: 20 mg via INTRAVENOUS

## 2011-10-13 MED ORDER — ACETAMINOPHEN 10 MG/ML IV SOLN
1000.0000 mg | Freq: Once | INTRAVENOUS | Status: AC
  Administered 2011-10-13: 1000 mg via INTRAVENOUS
  Filled 2011-10-13: qty 100

## 2011-10-13 MED ORDER — KETOROLAC TROMETHAMINE 30 MG/ML IJ SOLN
INTRAMUSCULAR | Status: AC
  Filled 2011-10-13: qty 1

## 2011-10-13 MED ORDER — FENTANYL CITRATE 0.05 MG/ML IJ SOLN
INTRAMUSCULAR | Status: DC | PRN
  Administered 2011-10-13 (×2): 50 ug via INTRAVENOUS
  Administered 2011-10-13: 100 ug via INTRAVENOUS
  Administered 2011-10-13: 50 ug via INTRAVENOUS
  Administered 2011-10-13: 100 ug via INTRAVENOUS

## 2011-10-13 MED ORDER — METOCLOPRAMIDE HCL 5 MG/ML IJ SOLN: 10.0000 mg | Freq: Once | INTRAMUSCULAR | Status: DC | PRN

## 2011-10-13 MED ORDER — FENTANYL CITRATE 0.05 MG/ML IJ SOLN
INTRAMUSCULAR | Status: AC
  Filled 2011-10-13: qty 10

## 2011-10-13 MED ORDER — GLYCOPYRROLATE 0.2 MG/ML IJ SOLN
INTRAMUSCULAR | Status: DC | PRN
  Administered 2011-10-13: .8 mg via INTRAVENOUS

## 2011-10-13 MED ORDER — OXYCODONE-ACETAMINOPHEN 5-325 MG PO TABS
1.0000 | ORAL_TABLET | ORAL | Status: DC | PRN
  Administered 2011-10-13: 1 via ORAL
  Filled 2011-10-13: qty 1

## 2011-10-13 MED ORDER — ROPIVACAINE HCL 5 MG/ML IJ SOLN
INTRAMUSCULAR | Status: AC
  Filled 2011-10-13: qty 60

## 2011-10-13 MED ORDER — PROPOFOL 10 MG/ML IV EMUL
INTRAVENOUS | Status: AC
  Filled 2011-10-13: qty 40

## 2011-10-13 MED ORDER — GLYCOPYRROLATE 0.2 MG/ML IJ SOLN
INTRAMUSCULAR | Status: AC
  Filled 2011-10-13: qty 2

## 2011-10-13 MED ORDER — MIDAZOLAM HCL 2 MG/2ML IJ SOLN
INTRAMUSCULAR | Status: AC
  Filled 2011-10-13: qty 2

## 2011-10-13 MED ORDER — ROPIVACAINE HCL 5 MG/ML IJ SOLN
INTRAMUSCULAR | Status: DC | PRN
  Administered 2011-10-13: 60 mL via EPIDURAL

## 2011-10-13 SURGICAL SUPPLY — 68 items
BAG URINE DRAINAGE (UROLOGICAL SUPPLIES) ×3 IMPLANT
BARRIER ADHS 3X4 INTERCEED (GAUZE/BANDAGES/DRESSINGS) IMPLANT
BENZOIN TINCTURE PRP APPL 2/3 (GAUZE/BANDAGES/DRESSINGS) ×3 IMPLANT
BLADE LAPAROSCOPIC MORCELL KIT (BLADE) IMPLANT
CABLE HIGH FREQUENCY MONO STRZ (ELECTRODE) ×3 IMPLANT
CATH FOLEY 3WAY  5CC 16FR (CATHETERS) ×1
CATH FOLEY 3WAY 5CC 16FR (CATHETERS) ×2 IMPLANT
CLOTH BEACON ORANGE TIMEOUT ST (SAFETY) ×3 IMPLANT
CONT PATH 16OZ SNAP LID 3702 (MISCELLANEOUS) ×3 IMPLANT
COVER MAYO STAND STRL (DRAPES) ×3 IMPLANT
COVER TABLE BACK 60X90 (DRAPES) ×6 IMPLANT
COVER TIP SHEARS 8 DVNC (MISCELLANEOUS) ×2 IMPLANT
COVER TIP SHEARS 8MM DA VINCI (MISCELLANEOUS) ×1
DECANTER SPIKE VIAL GLASS SM (MISCELLANEOUS) ×3 IMPLANT
DERMABOND ADVANCED (GAUZE/BANDAGES/DRESSINGS) ×1
DERMABOND ADVANCED .7 DNX12 (GAUZE/BANDAGES/DRESSINGS) ×2 IMPLANT
DEVICE TROCAR PUNCTURE CLOSURE (ENDOMECHANICALS) ×3 IMPLANT
DRAPE HUG U DISPOSABLE (DRAPE) ×3 IMPLANT
DRAPE LG THREE QUARTER DISP (DRAPES) ×6 IMPLANT
DRAPE MONITOR DA VINCI (DRAPE) IMPLANT
DRAPE WARM FLUID 44X44 (DRAPE) ×3 IMPLANT
ELECT REM PT RETURN 9FT ADLT (ELECTROSURGICAL) ×3
ELECTRODE REM PT RTRN 9FT ADLT (ELECTROSURGICAL) ×2 IMPLANT
EVACUATOR SMOKE 8.L (FILTER) ×3 IMPLANT
GAUZE VASELINE 3X9 (GAUZE/BANDAGES/DRESSINGS) IMPLANT
GLOVE BIO SURGEON STRL SZ 6.5 (GLOVE) ×9 IMPLANT
GLOVE ECLIPSE 6.5 STRL STRAW (GLOVE) ×9 IMPLANT
GOWN STRL REIN XL XLG (GOWN DISPOSABLE) ×18 IMPLANT
KIT ACCESSORY DA VINCI DISP (KITS) ×1
KIT ACCESSORY DVNC DISP (KITS) ×2 IMPLANT
KIT DISP ACCESSORY 4 ARM (KITS) IMPLANT
MANIPULATOR UTERINE 4.5 ZUMI (MISCELLANEOUS) IMPLANT
NEEDLE INSUFFLATION 14GA 120MM (NEEDLE) IMPLANT
NEEDLE SPNL 18GX3.5 QUINCKE PK (NEEDLE) IMPLANT
OCCLUDER COLPOPNEUMO (BALLOONS) ×3 IMPLANT
PACK LAVH (CUSTOM PROCEDURE TRAY) ×3 IMPLANT
PAD PREP 24X48 CUFFED NSTRL (MISCELLANEOUS) ×6 IMPLANT
PLUG CATH AND CAP STER (CATHETERS) ×3 IMPLANT
PROTECTOR NERVE ULNAR (MISCELLANEOUS) ×6 IMPLANT
SET CYSTO W/LG BORE CLAMP LF (SET/KITS/TRAYS/PACK) ×3 IMPLANT
SET IRRIG TUBING LAPAROSCOPIC (IRRIGATION / IRRIGATOR) ×3 IMPLANT
SOLUTION ELECTROLUBE (MISCELLANEOUS) ×3 IMPLANT
SPONGE LAP 18X18 X RAY DECT (DISPOSABLE) IMPLANT
STRIP CLOSURE SKIN 1/2X4 (GAUZE/BANDAGES/DRESSINGS) ×3 IMPLANT
SUT VIC AB 0 CT1 27 (SUTURE) ×2
SUT VIC AB 0 CT1 27XBRD ANBCTR (SUTURE) ×4 IMPLANT
SUT VIC AB 0 CT1 27XBRD ANTBC (SUTURE) IMPLANT
SUT VIC AB 2-0 CT2 27 (SUTURE) IMPLANT
SUT VICRYL 0 UR6 27IN ABS (SUTURE) ×6 IMPLANT
SUT VICRYL RAPIDE 4/0 PS 2 (SUTURE) ×6 IMPLANT
SUT VLOC 180 0 9IN  GS21 (SUTURE) ×1
SUT VLOC 180 0 9IN GS21 (SUTURE) ×2 IMPLANT
SYR 50ML LL SCALE MARK (SYRINGE) ×3 IMPLANT
SYSTEM CONVERTIBLE TROCAR (TROCAR) IMPLANT
TIP RUMI ORANGE 6.7MMX12CM (TIP) ×3 IMPLANT
TIP UTERINE 5.1X6CM LAV DISP (MISCELLANEOUS) IMPLANT
TIP UTERINE 6.7X10CM GRN DISP (MISCELLANEOUS) IMPLANT
TIP UTERINE 6.7X6CM WHT DISP (MISCELLANEOUS) IMPLANT
TIP UTERINE 6.7X8CM BLUE DISP (MISCELLANEOUS) IMPLANT
TOWEL OR 17X24 6PK STRL BLUE (TOWEL DISPOSABLE) ×6 IMPLANT
TROCAR 12M 150ML BLUNT (TROCAR) ×3 IMPLANT
TROCAR DISP BLADELESS 8 DVNC (TROCAR) ×2 IMPLANT
TROCAR DISP BLADELESS 8MM (TROCAR) ×1
TROCAR XCEL 12X100 BLDLESS (ENDOMECHANICALS) ×3 IMPLANT
TROCAR Z-THREAD 12X150 (TROCAR) ×3 IMPLANT
TROCAR Z-THREAD BLADED 12X100M (TROCAR) IMPLANT
TUBING FILTER THERMOFLATOR (ELECTROSURGICAL) ×3 IMPLANT
WATER STERILE IRR 1000ML POUR (IV SOLUTION) ×9 IMPLANT

## 2011-10-13 NOTE — Anesthesia Postprocedure Evaluation (Signed)
Anesthesia Post Note  Patient: Scientist, product/process development  Procedure(s) Performed: Procedure(s) (LRB): ROBOTIC ASSISTED TOTAL HYSTERECTOMY (N/A) CYSTOSCOPY (N/A) BILATERAL SALPINGECTOMY (N/A)  Anesthesia type: General  Patient location: PACU  Post pain: Pain level controlled  Post assessment: Post-op Vital signs reviewed  Last Vitals:  Filed Vitals:   10/13/11 1621  BP:   Pulse: 88  Temp:   Resp: 15    Post vital signs: Reviewed  Level of consciousness: sedated  Complications: No apparent anesthesia complicationsfj

## 2011-10-13 NOTE — H&P (Addendum)
Kaitlyn Rodgers is an 36 y.o. female.   Pertinent Gynecological History: Menses: flow is excessive with use of 2 but she "stuffs" her vagina with TP to conserve pads pads or tampons on heaviest days Bleeding: dysfunctional uterine bleeding (bleeds for 6-7 days) Contraception: coitus interruptus DES exposure: denies Blood transfusions: none Sexually transmitted diseases: no past history Previous GYN Procedures: cryo  Last mammogram: na Date: na Last pap: normal Date: 2013 OB History: G5, P4A1   Menstrual History: Menarche age: 34 No LMP recorded.    Past Medical History  Diagnosis Date  . DM II (diabetes mellitus, type II), controlled     metformin 500mg  qd  . Anemia   . Abnormal vaginal bleeding   . Fibroids   . Complication of anesthesia     panic attack after breast biopsy 20 years ago  . Heart murmur   . GERD (gastroesophageal reflux disease)     occasional indigestion-tums prn  . Asthma     seasonal asthma-using inhaler daily at present    Past Surgical History  Procedure Date  . Tubal ligation   . Biopsy breast age 50    benign     Family History  Problem Relation Age of Onset  . Heart disease Mother   . Diabetes Maternal Grandmother     Social History:  reports that she has never smoked. She has never used smokeless tobacco. She reports that she does not drink alcohol or use illicit drugs.  Allergies: No Known Allergies  Prescriptions prior to admission  Medication Sig Dispense Refill  . albuterol (PROVENTIL HFA;VENTOLIN HFA) 108 (90 BASE) MCG/ACT inhaler Inhale 1-2 puffs into the lungs every 6 (six) hours as needed for wheezing.  1 Inhaler  0  . ferrous sulfate 325 (65 FE) MG tablet Take 325 mg by mouth daily with breakfast.      . fish oil-omega-3 fatty acids 1000 MG capsule Take 2 g by mouth daily.      . megestrol (MEGACE) 40 MG tablet Take 40 mg by mouth daily.      . metFORMIN (GLUCOPHAGE) 500 MG tablet Take 500 mg by mouth daily.      .  fluticasone (FLONASE) 50 MCG/ACT nasal spray Place 2 sprays into the nose daily.  16 g  2    ROS Married for 13 years, LPN at [employer] Ultrasound shows 2 fibroids  Blood pressure 132/79, pulse 101, temperature 98.2 F (36.8 C), temperature source Oral, resp. rate 16, SpO2 100.00%. Physical Exam Heart- rrr Lungs- CTAB Abd- obese, benign  Results for orders placed during the hospital encounter of 10/13/11 (from the past 24 hour(s))  GLUCOSE, CAPILLARY     Status: Abnormal   Collection Time   10/13/11 11:39 AM      Component Value Range   Glucose-Capillary 119 (*) 70 - 99 (mg/dL)  PREGNANCY, URINE     Status: Normal   Collection Time   10/13/11 11:42 AM      Component Value Range   Preg Test, Ur NEGATIVE  NEGATIVE     No results found.  Assessment/Plan: Menorrhagia and anemia due to fibroids. After discussion of treatment options, she has opted for [facility].  Kaitlyn Rodgers C. 10/13/2011, 12:38 PM

## 2011-10-13 NOTE — Anesthesia Procedure Notes (Signed)
Procedure Name: Intubation Date/Time: 10/13/2011 1:19 PM Performed by: Harriett Rush, Murriel Holwerda A Pre-anesthesia Checklist: Patient identified, Emergency Drugs available, Suction available, Timeout performed and Patient being monitored Patient Re-evaluated:Patient Re-evaluated prior to inductionOxygen Delivery Method: Circle system utilized Preoxygenation: Pre-oxygenation with 100% oxygen Intubation Type: IV induction Ventilation: Mask ventilation without difficulty Laryngoscope Size: Miller and 2 Grade View: Grade II Tube type: Oral Tube size: 7.0 mm Number of attempts: 1 Secured at: 21 cm Tube secured with: Tape Dental Injury: Teeth and Oropharynx as per pre-operative assessment

## 2011-10-13 NOTE — Anesthesia Preprocedure Evaluation (Addendum)
Anesthesia Evaluation    History of Anesthesia Complications (+) Emergence Delirium  Airway Mallampati: III TM Distance: >3 FB Neck ROM: Full    Dental No notable dental hx. (+) Teeth Intact   Pulmonary asthma ,  breath sounds clear to auscultation  Pulmonary exam normal       Cardiovascular negative cardio ROS  + Valvular Problems/Murmurs MR Rhythm:Regular Rate:Normal     Neuro/Psych negative neurological ROS  negative psych ROS   GI/Hepatic Neg liver ROS, GERD-  Medicated and Controlled,  Endo/Other  Diabetes mellitus-, Well Controlled, Type 2, Oral Hypoglycemic AgentsMorbid obesity  Renal/GU negative Renal ROS  negative genitourinary   Musculoskeletal negative musculoskeletal ROS (+)   Abdominal (+) + obese,  Abdomen: soft.    Peds  Hematology negative hematology ROS (+)   Anesthesia Other Findings   Reproductive/Obstetrics Leiomyoma uteri                         Anesthesia Physical Anesthesia Plan  ASA: III  Anesthesia Plan: General   Post-op Pain Management:    Induction: Intravenous, Cricoid pressure planned and Rapid sequence  Airway Management Planned: Oral ETT  Additional Equipment:   Intra-op Plan:   Post-operative Plan: Extubation in OR  Informed Consent: I have reviewed the patients History and Physical, chart, labs and discussed the procedure including the risks, benefits and alternatives for the proposed anesthesia with the patient or authorized representative who has indicated his/her understanding and acceptance.   Dental advisory given  Plan Discussed with: CRNA, Anesthesiologist and Surgeon  Anesthesia Plan Comments:         Anesthesia Quick Evaluation

## 2011-10-13 NOTE — Discharge Instructions (Signed)
Hysterectomy Information  A hysterectomy is a procedure where your uterus is surgically removed. It will no longer be possible to have menstrual periods or to become pregnant. The tubes and ovaries can be removed (bilateral salpingo-oopherectomy) during this surgery as well.  REASONS FOR A HYSTERECTOMY  Persistent, abnormal bleeding.   Lasting (chronic) pelvic pain or infection.   The lining of the uterus (endometrium) starts growing outside the uterus (endometriosis).   The endometrium starts growing in the muscle of the uterus (adenomyosis).   The uterus falls down into the vagina (pelvic organ prolapse).   Symptomatic uterine fibroids.   Precancerous cells.   Cervical cancer or uterine cancer.  TYPES OF HYSTERECTOMIES  Supracervical hysterectomy. This type removes the top part of the uterus, but not the cervix.   Total hysterectomy. This type removes the uterus and cervix.   Radical hysterectomy. This type removes the uterus, cervix, and the fibrous tissue that holds the uterus in place in the pelvis (parametrium).  WAYS A HYSTERECTOMY CAN BE PERFORMED  Abdominal hysterectomy. A large surgical cut (incision) is made in the abdomen. The uterus is removed through this incision.   Vaginal hysterectomy. An incision is made in the vagina. The uterus is removed through this incision. There are no abdominal incisions.   Conventional laparoscopic hysterectomy. A thin, lighted tube with a camera (laparoscope) is inserted into 3 or 4 small incisions in the abdomen. The uterus is cut into small pieces. The small pieces are removed through the incisions, or they are removed through the vagina.   Laparoscopic assisted vaginal hysterectomy (LAVH). Three or four small incisions are made in the abdomen. Part of the surgery is performed laparoscopically and part vaginally. The uterus is removed through the vagina.   Robot-assisted laparoscopic hysterectomy. A laparoscope is inserted into 3 or 4  small incisions in the abdomen. A computer-controlled device is used to give the surgeon a 3D image. This allows for more precise movements of surgical instruments. The uterus is cut into small pieces and removed through the incisions or removed through the vagina.  RISKS OF HYSTERECTOMY   Bleeding and risk of blood transfusion. Tell your caregiver if you do not want to receive any blood products.   Blood clots in the legs or lung.   Infection.   Injury to surrounding organs.   Anesthesia problems or side effects.   Conversion to an abdominal hysterectomy.  WHAT TO EXPECT AFTER A HYSTERECTOMY  You will be given pain medicine.   You will need to have someone with you for the first 3 to 5 days after you go home.   You will need to follow up with your surgeon in 2 to 4 weeks after surgery to evaluate your progress.   You may have early menopause symptoms like hot flashes, night sweats, and insomnia.   If you had a hysterectomy for a problem that was not a cancer or a condition that could lead to cancer, then you no longer need Pap tests. However, even if you no longer need a Pap test, a regular exam is a good idea to make sure no other problems are starting.  Document Released: 12/03/2000 Document Revised: 05/29/2011 Document Reviewed: 01/18/2011 [employer] Patient Information 2012 [employer]. 

## 2011-10-13 NOTE — Op Note (Addendum)
10/13/2011  3:23 PM  PATIENT:  Kaitlyn Rodgers  36 y.o. female  PRE-OPERATIVE DIAGNOSIS:  Large fibroid uterus and menorrhagia resulting in anemia, morbid obesity  POST-OPERATIVE DIAGNOSIS:  same  PROCEDURE:  Procedure(s) (LRB): ROBOTIC ASSISTED TOTAL HYSTERECTOMY (N/A) CYSTOSCOPY (N/A) BILATERAL SALPINGECTOMY (N/A)  SURGEON:  Surgeon(s) and Role:    * Allie Bossier, MD - Primary    * Lesly Dukes, MD - Assisting    * Levie Heritage, DO - Assisting  PHYSICIAN ASSISTANT:   ASSISTANTS: as above   ANESTHESIA:   general  EBL:  Total I/O In: 1000 [I.V.:1000] Out: 300 [Urine:150; Blood:150]  BLOOD ADMINISTERED:none  DRAINS: none   LOCAL MEDICATIONS USED:  OTHER ropivicaine  SPECIMEN:  Source of Specimen:  uterus, oviducts  DISPOSITION OF SPECIMEN:  PATHOLOGY  COUNTS:  YES  TOURNIQUET:  * No tourniquets in log *  DICTATION: .Dragon Dictation  PLAN OF CARE: outpatient with extended recovery  PATIENT DISPOSITION:  PACU - hemodynamically stable.   Delay start of Pharmacological VTE agent (>24hrs) due to surgical blood loss or risk of bleeding: not applicable  The risks, benefits, and alternatives of surgery were explained, understood, and accepted. All questions were answered. Consents were signed. She was taken to the operating room and placed in a dorsal lithotomy position. Much or Porter soft to be comfortable, general anesthesia was applied without complication. Her arms and legs and head were carefully packed and tucked. Her abdomen and vagina were prepped and draped in the usual sterile fashion. A bimanual exam revealed a 10 week size mobile uterus and nonenlarged adnexa. Please note that her morbid obesity did lenthen the time of draping as well as the time of placing the laparoscopic ports. In fact we had to use in 15 cm assistant port after having large placed a routine assistant port. The case was extended via at least 30 minutes do to her central obesity. Once her  abdomen was prepped and draped in the usual sterile fashion, dilute ropivacaine was used to inject the umbilicus for postop pain relief. A varies needle was placed after making a 10 mm incision in the umbilicus. The peritoneum was insufflated with carbon dioxide. Patient abdominal pressure was always less than 15. A 11 mm port was placed. Laparoscopy firmed correct  Placement. And at 8 mm port was placed under direct laparoscopic visualization approximately 10 cm lateral to the umbilicus on each side. Please note that prior to making the incisions to ropivacaine was injected into the subcutaneous tissue. A 12 mm system port was placed in right lower quadrant under direct laparoscopic visualization, also after having to ropivacaine injected into the subcutaneous tissue. She was placed in deep Trendelenburg and the robot was docked. Please note that prior to proceeding with the abdominal portion of the case, I sounded her uterus to 14 cm and a Rumi uterine manipulator was placed easily. After the robot was docked, I proceeded with the tonsil portion of the case. The pelvis was inspected, noting normal adnexa with a previous tubal ligation noted. The position of the ureters were noted throughout case. The utero-ovarian ligaments were cauterized with the PK device. The round ligaments were also cauterized with the PK device. The uterine vessels were skeletonized and cauterized with the PK device as well. Please note that prior to the skeletonization of the uterine vessels, a bladder flap was created anteriorly. The colpotomy was made on the anterior portion of the vagina, and the incision was carried circumferentially. The uterus  was removed the vagina. A 0 V lock suture was used to close the vaginal cuff and a running nonlocking fashion. Excellent hemostasis was noted. I removed the oviducts with the PK device and again noted excellent hemostasis. I used a laparoscopic fascial closure device to close the 12 mm system  port fascia. No defects were palpable. I elevated the umbilical fashion and closed with a 0 Vicryl figure-of-eight suture. The subcuticular closures were all done with 4-0 Vicryl suture. I then proceeded with a cystoscopy. The patient had been given P0 pyridium prior to the case. I was able to see orange urine coming from her left ureter immediately. When several minutes passed and I did not see a from the left ureter, I ordered IV indigo carmine. Immediately after the indigo carmine had been given, I did see orange urine from her right ureter as well. She was extubated and taken to the recovery room in stable condition. Instrument sponge and needle counts were correct.

## 2011-10-13 NOTE — Progress Notes (Signed)
Patient d/c'd home with significant other.  Discharged instructions reviewed with patient.  Rx. Given,  Pneumonia vaccine given to patient left deltoid.  Denies pain at this time,  IV discontinued.  Copy of discharge instructions given to patient

## 2011-10-13 NOTE — Transfer of Care (Signed)
Immediate Anesthesia Transfer of Care Note  Patient: Kaitlyn Rodgers  Procedure(s) Performed: Procedure(s) (LRB): ROBOTIC ASSISTED TOTAL HYSTERECTOMY (N/A) CYSTOSCOPY (N/A) BILATERAL SALPINGECTOMY (N/A)  Patient Location: PACU  Anesthesia Type: General  Level of Consciousness: awake and sedated  Airway & Oxygen Therapy: Patient Spontanous Breathing and Patient connected to nasal cannula oxygen  Post-op Assessment: Report given to PACU RN and Post -op Vital signs reviewed and stable  Post vital signs: Reviewed and stable  Complications: No apparent anesthesia complications

## 2011-10-14 ENCOUNTER — Encounter (HOSPITAL_COMMUNITY): Payer: Self-pay | Admitting: Obstetrics & Gynecology

## 2011-10-14 NOTE — Discharge Summary (Signed)
Physician Discharge Summary  Patient ID: Kaitlyn Rodgers MRN: [id number] DOB/AGE: 07/10/1975 35 y.o.  Admit date: 10/13/2011 Discharge date: 10/14/2011  Admission Diagnoses: Large fibroid uterus and menorrhagia resulting in anemia  Discharge Diagnoses: same and s/p robotic assisted hysterectomy and bilateral salpingectomy Active Problems:  * No active hospital problems. *    Discharged Condition: good  Hospital Course: 36 yo [phone] with fibroid uterus and menorrhagia resulting in severe anemia presented to the hospital for scheduled robotic assisted hysterectomy and bilateral salpingectomy. Throughout her stay, the patient's vital signs remained stable, she maintained good urine output and her pain was well controlled.  Consults: None  Significant Diagnostic Studies:   Treatments: surgery: robotic assisted hysterectomy, bilateral salpingectomy, and cystoscopy  Discharge Exam: Blood pressure 113/68, pulse 97, temperature 98.1 F (36.7 C), temperature source Oral, resp. rate 22, height 5\' 4"  (1.626 m), weight 157.852 kg (348 lb), SpO2 97.00%. General appearance: alert, cooperative and no distress Resp: clear to auscultation bilaterally Cardio: regular rate and rhythm GI: soft, non-tender; bowel sounds normal; no masses,  no organomegaly Extremities: Homans sign is negative, no sign of DVT and no edema, redness or tenderness in the calves or thighs Dressings: clean, dry and intact Disposition: 01-Home or Self Care   Medication List  As of 10/14/2011  6:36 AM   STOP taking these medications         megestrol 40 MG tablet         TAKE these medications         albuterol 108 (90 BASE) MCG/ACT inhaler   Commonly known as: PROVENTIL HFA;VENTOLIN HFA   Inhale 1-2 puffs into the lungs every 6 (six) hours as needed for wheezing.      ferrous sulfate 325 (65 FE) MG tablet   Take 325 mg by mouth daily with breakfast.      fish oil-omega-3 fatty acids 1000 MG capsule   Take 2 g by  mouth daily.      fluticasone 50 MCG/ACT nasal spray   Commonly known as: FLONASE   Place 2 sprays into the nose daily.      ibuprofen 800 MG tablet   Commonly known as: ADVIL,MOTRIN   Take 1 tablet (800 mg total) by mouth every 8 (eight) hours as needed for pain.      metFORMIN 500 MG tablet   Commonly known as: GLUCOPHAGE   Take 500 mg by mouth daily.      oxyCODONE-acetaminophen 5-325 MG per tablet   Commonly known as: PERCOCET   Take 1 tablet by mouth every 4 (four) hours as needed for pain.           Follow-up Information    Follow up with Allie Bossier., MD.   Contact information:   [address] [place] [address] [phone]          Signed: Catalina Antigua 10/14/2011, 6:36 AM

## 2011-10-28 ENCOUNTER — Telehealth: Payer: Self-pay | Admitting: *Deleted

## 2011-10-28 NOTE — Telephone Encounter (Signed)
Pt left message stating that she had a hysterectomy 2 wks ago. She is having very bad gas and burning of abdomen. She also reports some vaginal bleeding and passing clots from straining so much from the gas. Please call back.

## 2011-10-29 NOTE — Telephone Encounter (Signed)
Called Kaitlyn Rodgers, left message we are returning your call from yesterday, please call us back and let us know how you are doing today. If you are having severe problems go to [facility]

## 2011-10-30 NOTE — Telephone Encounter (Signed)
Spoke with patient about her bleeding/discharge. She is having a pinkish discharge that does not have odor. I explained that this is normal and part of her healing process. Pt also has concerns of constipation. She admits that she is not eating much or drinking water. I told her that she can take colace or miralax and that she should increase her fluids. Pt agrees with this and will call us back if she needs anything further.

## 2011-11-26 ENCOUNTER — Encounter: Payer: Self-pay | Admitting: Obstetrics & Gynecology

## 2011-11-26 ENCOUNTER — Ambulatory Visit (INDEPENDENT_AMBULATORY_CARE_PROVIDER_SITE_OTHER): Payer: 59 | Admitting: Obstetrics & Gynecology

## 2011-11-26 VITALS — BP 135/86 | HR 85 | Temp 97.6°F | Ht 64.0 in | Wt 344.5 lb

## 2011-11-26 DIAGNOSIS — Z09 Encounter for follow-up examination after completed treatment for conditions other than malignant neoplasm: Secondary | ICD-10-CM

## 2011-11-26 DIAGNOSIS — Z9889 Other specified postprocedural states: Secondary | ICD-10-CM

## 2011-11-26 NOTE — Progress Notes (Signed)
  Subjective:    Patient ID: Kaitlyn Rodgers, female    DOB: 09-27-1975, 36 y.o.   MRN: [id number]  HPI Kaitlyn Rodgers is now 6 weeks post op status post RATH/BS. She has no complaints- normal bowel and bladder function. She had "short penetration" sex last week.   Review of Systems    pathol shows fibroids Objective:   Physical Exam Well-healed incisions and vaginal cuff Bimanual exam normal       Assessment & Plan:  Post op- doing well. She may return to work.

## 2013-09-25 ENCOUNTER — Emergency Department (INDEPENDENT_AMBULATORY_CARE_PROVIDER_SITE_OTHER)
Admission: EM | Admit: 2013-09-25 | Discharge: 2013-09-25 | Disposition: A | Discharge status: Home / Self Care | Payer: Self-pay | Source: Home / Self Care | Attending: Emergency Medicine | Admitting: Emergency Medicine

## 2013-09-25 ENCOUNTER — Encounter (HOSPITAL_COMMUNITY): Payer: Self-pay | Admitting: Emergency Medicine

## 2013-09-25 DIAGNOSIS — H8110 Benign paroxysmal vertigo, unspecified ear: Secondary | ICD-10-CM

## 2013-09-25 MED ORDER — MECLIZINE HCL 25 MG PO TABS: 25.0000 mg | ORAL_TABLET | Freq: Three times a day (TID) | ORAL | Status: DC | PRN

## 2013-09-25 NOTE — ED Notes (Signed)
Pt c/o dizziness and nausea x 1 day. Pt reports she has a hx of vertigo but ran out of medication. Felt worse yesterday but after resting she feels better today. Needs note for work and refill on medication. Pt is alert and oriented and in no acute distress.

## 2013-09-25 NOTE — ED Provider Notes (Signed)
Chief Complaint   Chief Complaint  Patient presents with  . Dizziness    History of Present Illness   Kaitlyn Rodgers is a 38 year old female who has a two-day history of vertigo. This feels like she is spinning around or rocking back and forth. The episode came on after she had her hair washed, during which time she had her neck extended for a long period of time. The vertigo is worse with any kind of movement and if she bends over, and is better if she lies down and is completely quiet. She feels nauseated with it but has not vomited. She denies any difficulty hearing, ear pain, or ringing in the ears. She's had no headache, diplopia, blurry vision, nasal congestion, rhinorrhea, or sore throat. She denies any shortness of breath, chest pain, or palpitations. No paresthesias, weakness, difficulty with speech or ambulation. She has had the same thing before. She thinks her last episode was years ago. She has taken meclizine in the past with good results.  Review of Systems   Other than noted above, the patient denies any of the following symptoms: Systemic:  No fever, chills, fatigue, or weight loss. Eye:  No blurred vision, visual change or diplopia. ENT:  No ear pain, tinnitus, hearing loss, nasal congestion, or rhinorrhea. Cardiac:  No chest pain, dyspnea, palpitations or syncope. Neuro:  No headache, paresthesias, weakness, trouble with speech, coordination or ambulation.  [place]   Past medical history, family history, social history, meds, and allergies were reviewed.    Physical Examination     Vital signs:  BP 116/78  Pulse 78  Temp(Src) 98.6 F (37 C) (Oral)  Resp 18  SpO2 98%  LMP 07/09/2011 Filed Vitals:   09/25/13 1453  BP: 116/78  Pulse: 78  Temp: 98.6 F (37 C)  TempSrc: Oral  Resp: 18  SpO2: 98%   General:  Alert, oriented times 3, in no distress. Eye:  PERRL, full EOM, no nystagmus. ENT:  TMs and canals normal.  Nasal mucosa normal.  Pharynx clear. Neck:   No adenopathy, tenderness, or mass.  Thyroid normal.  No carotid bruit. Lungs:  Breath sounds clear and equal bilaterally.  No wheezes, rales or rhonchi. Heart:  Regular rhythm.  No gallops, murmers, or rubs. Neuro:  Alert and oriented times 3.  Cranial nerves intact.  No pronator drift.  Finger to nose normal.  No focal weakness.  Sensation intact to light touch.  Romberg's sign negative, gait normal.  Able to do tandem gait well.  Dix-Hallpike maneuver was positive with the left ear down.  [facility]   An Epley maneuver was performed.  Assessment   The encounter diagnosis was Benign positional vertigo.  Probably due to canalithiasis.  Plan     1.  Meds:  The following meds were prescribed:   Discharge Medication List as of 09/25/2013  3:22 PM    START taking these medications   Details  meclizine (ANTIVERT) 25 MG tablet Take 1 tablet (25 mg total) by mouth 3 (three) times daily as needed for dizziness., Starting 09/25/2013, Until Discontinued, Normal        2.  Patient Education/Counseling:  The patient was given appropriate handouts, self care instructions, and instructed in symptomatic relief.  Instructed in Epley exercises.  3.  Follow up:  The patient was told to follow up here if no better in 3 to 4 days, or sooner if becoming worse in any way, and given some red flag symptoms  such as new neurological symptoms, chest pain, or syncope which would prompt immediate return.  Followup with Dr. Wilburn Cornelia if no better in 2 weeks.     Harden Mo, MD 09/25/13 [phone]

## 2013-09-25 NOTE — Discharge Instructions (Signed)
You have been diagnosed with benign positional vertigo.  The cause of this is a displaced particle or crystal in the inner ear.  This can be cured by doing the exercise described below, called the Epley exercise developed by Dr. Horald Chestnut.  Do this exercise three times daily until you are able to do it for 24 hours without getting dizzy.  Sleep with your head elevated and try to avoid bending over.  If the dizziness is not better in 7 days, return here, see your primary care doctor or ENT doctor.    Benign Positional Vertigo Vertigo means you feel like you or your surroundings are moving when they are not. Benign positional vertigo is the most common form of vertigo. Benign means that the cause of your condition is not serious. Benign positional vertigo is more common in older adults. CAUSES  Benign positional vertigo is the result of an upset in the labyrinth system. This is an area in the middle ear that helps control your balance. This may be caused by a viral infection, head injury, or repetitive motion. However, often no specific cause is found. SYMPTOMS  Symptoms of benign positional vertigo occur when you move your head or eyes in different directions. Some of the symptoms may include:  Loss of balance and falls.  Vomiting.  Blurred vision.  Dizziness.  Nausea.  Involuntary eye movements (nystagmus). DIAGNOSIS  Benign positional vertigo is usually diagnosed by physical exam. If the specific cause of your benign positional vertigo is unknown, your caregiver may perform imaging tests, such as magnetic resonance imaging (MRI) or computed tomography (CT). TREATMENT  Your caregiver may recommend movements or procedures to correct the benign positional vertigo. Medicines such as meclizine, benzodiazepines, and medicines for nausea may be used to treat your symptoms. In rare cases, if your symptoms are caused by certain conditions that affect the inner ear, you may need surgery. HOME CARE  INSTRUCTIONS   Follow your caregiver's instructions.  Move slowly. Do not make sudden body or head movements.  Avoid driving.  Avoid operating heavy machinery.  Avoid performing any tasks that would be dangerous to you or others during a vertigo episode.  Drink enough fluids to keep your urine clear or pale yellow. SEEK IMMEDIATE MEDICAL CARE IF:   You develop problems with walking, weakness, numbness, or using your arms, hands, or legs.  You have difficulty speaking.  You develop severe headaches.  Your nausea or vomiting continues or gets worse.  You develop visual changes.  Your family or friends notice any behavioral changes.  Your condition gets worse.  You have a fever.  You develop a stiff neck or sensitivity to light. MAKE SURE YOU:   Understand these instructions.  Will watch your condition.  Will get help right away if you are not doing well or get worse. Document Released: 03/17/2006 Document Revised: 09/01/2011 Document Reviewed: 02/27/2011 [facility] Patient Information 2014 [place].

## 2014-04-11 ENCOUNTER — Emergency Department (INDEPENDENT_AMBULATORY_CARE_PROVIDER_SITE_OTHER): Payer: Self-pay

## 2014-04-11 ENCOUNTER — Emergency Department (INDEPENDENT_AMBULATORY_CARE_PROVIDER_SITE_OTHER)
Admission: EM | Admit: 2014-04-11 | Discharge: 2014-04-11 | Disposition: A | Discharge status: Home / Self Care | Payer: Self-pay | Source: Home / Self Care | Attending: Emergency Medicine | Admitting: Emergency Medicine

## 2014-04-11 ENCOUNTER — Encounter (HOSPITAL_COMMUNITY): Payer: Self-pay | Admitting: Emergency Medicine

## 2014-04-11 DIAGNOSIS — M7730 Calcaneal spur, unspecified foot: Secondary | ICD-10-CM

## 2014-04-11 MED ORDER — PREDNISONE 50 MG PO TABS: ORAL_TABLET | ORAL | Status: DC

## 2014-04-11 NOTE — ED Notes (Signed)
C/o left heel pain, no known injury.  Pain is sharp and travels around heel.  Pain for 2 months, gradually getting worse.

## 2014-04-11 NOTE — Discharge Instructions (Signed)
Heel Spur °A heel spur is a hook of bone that can form on the calcaneus (the heel bone and the largest bone of the foot). Heel spurs are often associated with plantar fasciitis and usually come in people who have had the problem for an extended period of time. The cause of the relationship is unknown. The pain associated with them is thought to be caused by an inflammation (soreness and redness) of the plantar fascia rather than the spur itself. The plantar fascia is a thick fibrous like tissue that runs from the calcaneus (heel bone) to the ball of the foot. This strong, tight tissue helps maintain the arch of your foot. It helps distribute the weight across your foot as you walk or run. Stresses placed on the plantar fascia can be tremendous. When it is inflamed normal activities become painful. Pain is worse in the morning after sleeping. After sleeping the plantar fascia is tight. The first movements stretch the fascia and this causes pain. As the tendon loosens, the pain usually gets better. It often returns with too much standing or walking.  °About 70% of patients with plantar fasciitis have a heel spur. About half of people without foot pain also have heel spurs. °DIAGNOSIS  °The diagnosis of a heel spur is made by X-ray. The X-ray shows a hook of bone protruding from the bottom of the calcaneus at the point where the plantar fascia is attached to the heel bone.  °TREATMENT °· It is necessary to find out what is causing the stretching of the plantar fascia. If the cause is over-pronation (flat feet), orthotics and proper foot ware may help. °· Stretching exercises, losing weight, wearing shoes that have a cushioned heel that absorbs shock, and elevating the heel with the use of a heel cradle, heel cup, or orthotics may all help. Heel cradles and heel cups provide extra comfort and cushion to the heel, and reduce the amount of shock to the sore area. °AVOIDING THE PAIN OF PLANTAR FASCIITIS AND HEEL  SPURS °· Consult a sports medicine professional before beginning a new exercise program. °· Walking programs offer a good workout. There is a lower chance of overuse injuries common to the runners. There is less impact and less jarring of the joints. °· Begin all new exercise programs slowly. If problems or pains develop, decrease the amount of time or distance until you are at a comfortable level. °· Wear good shoes and replace them regularly. °· Stretch your foot and the heel cords at the back of the ankle (Achilles tendons) both before and after exercise. °· Run or exercise on even surfaces that are not hard. For example, asphalt is better than pavement. °· Do not run barefoot on hard surfaces. °· If using a treadmill, vary the incline. °· Do not continue to workout if you have foot or joint problems. Seek professional help if they do not improve. °HOME CARE INSTRUCTIONS  °· Avoid activities that cause you pain until you recover. °· Use ice or cold packs to the problem or painful areas after working out. °· Only take over-the-counter or prescription medicines for pain, discomfort, or fever as directed by your caregiver. °· Soft shoe inserts or athletic shoes with air or gel sole cushions may be helpful. °· If problems continue or become more severe, consult a sports medicine caregiver. Cortisone is a potent anti-inflammatory medication that may be injected into the painful area. You can discuss this treatment with your caregiver. °MAKE SURE YOU:  °·   Understand these instructions.  Will watch your condition.  Will get help right away if you are not doing well or get worse. Document Released: 07/16/2005 Document Revised: 09/01/2011 Document Reviewed: 08/10/2013 [employer] Patient Information 2015 [employer]. This information is not intended to replace advice given to you by your health care provider. Make sure you discuss any questions you have with your health care provider.  

## 2014-04-11 NOTE — ED Provider Notes (Signed)
CSN: [id number]     Arrival date & time 04/11/14  1649 History   None    Chief Complaint  Patient presents with  . Foot Pain   (Consider location/radiation/quality/duration/timing/severity/associated sxs/prior Treatment) HPI She is a 38 year old woman here for evaluation of left heel pain. This has been going on for about 2 months, but has gradually been getting worse. The pain is located throughout the left heel. It does not radiate. It gets worse with prolonged standing or walking. The foot will swell after being on it all day. She denies any numbness, tingling, weakness in the foot. She denies any recent injury to the foot. She works as a Marine scientist. She also walks 4 miles 3-4 times a week. This is done at various parts. Her shoes were last replaced 4-5 months ago. She has tried icing, Tylenol, ibuprofen, soaks with minimal improvement  Past Medical History  Diagnosis Date  . DM II (diabetes mellitus, type II), controlled     metformin 500mg  qd  . Anemia   . Abnormal vaginal bleeding   . Fibroids   . Complication of anesthesia     panic attack after breast biopsy 20 years ago  . Heart murmur   . GERD (gastroesophageal reflux disease)     occasional indigestion-tums prn  . Asthma     seasonal asthma-using inhaler daily at present   Past Surgical History  Procedure Laterality Date  . Tubal ligation    . Biopsy breast  age 30    benign   . Cystoscopy  10/13/2011    Procedure: CYSTOSCOPY;  Surgeon: Emily Filbert, MD;  Location: [facility];  Service: Gynecology;  Laterality: N/A;  . Abdominal hysterectomy     Family History  Problem Relation Age of Onset  . Heart disease Mother   . Diabetes Maternal Grandmother    History  Substance Use Topics  . Smoking status: Never Smoker   . Smokeless tobacco: Never Used  . Alcohol Use: No   OB History   Grav Para Term Preterm Abortions TAB SAB Ect Mult Living   5 4 4  1 1    4      Review of Systems  Musculoskeletal:       Left heel  pain Left foot swelling    Allergies  Review of patient's allergies indicates no known allergies.  Home Medications   Prior to Admission medications   Medication Sig Start Date End Date Taking? Authorizing Provider  ferrous sulfate 325 (65 FE) MG tablet Take 325 mg by mouth daily with breakfast.    Historical Provider, MD  fish oil-omega-3 fatty acids 1000 MG capsule Take 2 g by mouth daily.    Historical Provider, MD  meclizine (ANTIVERT) 25 MG tablet Take 1 tablet (25 mg total) by mouth 3 (three) times daily as needed for dizziness. 09/25/13   Harden Mo, MD  metFORMIN (GLUCOPHAGE) 500 MG tablet Take 500 mg by mouth daily.    Historical Provider, MD  predniSONE (DELTASONE) 50 MG tablet Take 1 pill daily for 5 days. 04/11/14   Melony Overly, MD   BP 101/68  Pulse 67  Temp(Src) 98 F (36.7 C) (Oral)  Resp 16  SpO2 100%  LMP 07/09/2011 Physical Exam  Constitutional: She is oriented to person, place, and time. She appears well-developed and well-nourished. No distress.  obese  Cardiovascular: Normal rate.   Pulmonary/Chest: Effort normal.  Musculoskeletal: She exhibits no edema.  Left foot: no erythema or edema.  Tender  over calcaneus.  No tenderness of plantar fascia or achilles.  5/5 strength in dorsiflexion, plantar flexion and great toe extension.  2+ DP pulse.  Neurological: She is alert and oriented to person, place, and time.    ED Course  Procedures (including critical care time) Labs Review Labs Reviewed - No data to display  Imaging Review Dg Foot Complete Left  04/11/2014   CLINICAL DATA:  Left foot pain for 2 months. No known injury. Initial encounter.  EXAM: LEFT FOOT - COMPLETE 3+ VIEW  COMPARISON:  None.  FINDINGS: Imaged bones, joints and soft tissues appear normal. Small plantar calcaneal spur is incidentally noted.  IMPRESSION: Negative exam.   Electronically Signed   By: Inge Rise M.D.   On: 04/11/2014 18:56     MDM   1. Inferior calcaneal bone  spur    Pain is likely secondary to bone spur. Will treat with a five-day course of prednisone. Recommended icing and heel cups. Continue Tylenol and ibuprofen as needed. Followup with sports medicine or orthopedics as needed.    Melony Overly, MD 04/11/14 [phone]

## 2014-04-24 ENCOUNTER — Encounter (HOSPITAL_COMMUNITY): Payer: Self-pay | Admitting: Emergency Medicine

## 2015-02-07 ENCOUNTER — Encounter (HOSPITAL_COMMUNITY): Payer: Self-pay | Admitting: *Deleted

## 2015-02-07 ENCOUNTER — Emergency Department (INDEPENDENT_AMBULATORY_CARE_PROVIDER_SITE_OTHER)
Admission: EM | Admit: 2015-02-07 | Discharge: 2015-02-07 | Disposition: A | Discharge status: Home / Self Care | Payer: Self-pay | Source: Home / Self Care | Attending: Family Medicine | Admitting: Family Medicine

## 2015-02-07 DIAGNOSIS — J0101 Acute recurrent maxillary sinusitis: Secondary | ICD-10-CM

## 2015-02-07 DIAGNOSIS — J4520 Mild intermittent asthma, uncomplicated: Secondary | ICD-10-CM

## 2015-02-07 MED ORDER — DOXYCYCLINE HYCLATE 100 MG PO CAPS: 100.0000 mg | ORAL_CAPSULE | Freq: Two times a day (BID) | ORAL | Status: DC

## 2015-02-07 MED ORDER — IPRATROPIUM BROMIDE 0.06 % NA SOLN: 2.0000 | Freq: Four times a day (QID) | NASAL | Status: DC

## 2015-02-07 NOTE — ED Notes (Signed)
Pt  Reports  Symptoms  Of  Cough  /  Congested   As  Well  As      Runny  Nose  /  Body  Aches         Symptoms    X  3  Weeks         Pt  Reports  Symptoms  Not  releived  By otc  meds

## 2015-02-07 NOTE — Discharge Instructions (Signed)
Drink plenty of fluids as discussed, use medicine as prescribed, and mucinex or delsym for cough. Return or see your doctor if further problems °

## 2015-02-07 NOTE — ED Provider Notes (Signed)
CSN: [id number]     Arrival date & time 02/07/15  1702 History   First MD Initiated Contact with Patient 02/07/15 1733     Chief Complaint  Patient presents with  . URI   (Consider location/radiation/quality/duration/timing/severity/associated sxs/prior Treatment) Patient is a 39 y.o. female presenting with URI. The history is provided by the patient.  URI Presenting symptoms: congestion, cough and rhinorrhea   Presenting symptoms: no fever   Severity:  Moderate Onset quality:  Gradual Duration:  3 weeks Timing:  Intermittent Progression:  Waxing and waning Chronicity:  New Relieved by:  None tried Worsened by:  Nothing tried Ineffective treatments:  None tried Associated symptoms: sneezing     Past Medical History  Diagnosis Date  . DM II (diabetes mellitus, type II), controlled     metformin 500mg  qd  . Anemia   . Abnormal vaginal bleeding   . Fibroids   . Complication of anesthesia     panic attack after breast biopsy 20 years ago  . Heart murmur   . GERD (gastroesophageal reflux disease)     occasional indigestion-tums prn  . Asthma     seasonal asthma-using inhaler daily at present   Past Surgical History  Procedure Laterality Date  . Tubal ligation    . Biopsy breast  age 79    benign   . Cystoscopy  10/13/2011    Procedure: CYSTOSCOPY;  Surgeon: Emily Filbert, MD;  Location: [facility];  Service: Gynecology;  Laterality: N/A;  . Abdominal hysterectomy     Family History  Problem Relation Age of Onset  . Heart disease Mother   . Diabetes Maternal Grandmother    Social History  Substance Use Topics  . Smoking status: Never Smoker   . Smokeless tobacco: Never Used  . Alcohol Use: No   OB History    Gravida Para Term Preterm AB TAB SAB Ectopic Multiple Living   5 4 4  1 1    4      Review of Systems  Constitutional: Negative.  Negative for fever.  HENT: Positive for congestion, postnasal drip, rhinorrhea and sneezing.   Respiratory: Positive for cough.  Negative for shortness of breath.   Cardiovascular: Negative.     Allergies  Review of patient's allergies indicates no known allergies.  Home Medications   Prior to Admission medications   Medication Sig Start Date End Date Taking? Authorizing Provider  doxycycline (VIBRAMYCIN) 100 MG capsule Take 1 capsule (100 mg total) by mouth 2 (two) times daily. 02/07/15   Billy Fischer, MD  ferrous sulfate 325 (65 FE) MG tablet Take 325 mg by mouth daily with breakfast.    Historical Provider, MD  fish oil-omega-3 fatty acids 1000 MG capsule Take 2 g by mouth daily.    Historical Provider, MD  ipratropium (ATROVENT) 0.06 % nasal spray Place 2 sprays into both nostrils 4 (four) times daily. 02/07/15   Billy Fischer, MD  meclizine (ANTIVERT) 25 MG tablet Take 1 tablet (25 mg total) by mouth 3 (three) times daily as needed for dizziness. 09/25/13   Harden Mo, MD  metFORMIN (GLUCOPHAGE) 500 MG tablet Take 500 mg by mouth daily.    Historical Provider, MD  predniSONE (DELTASONE) 50 MG tablet Take 1 pill daily for 5 days. 04/11/14   Melony Overly, MD   BP 138/77 mmHg  Pulse 102  Temp(Src) 98.7 F (37.1 C) (Oral)  Resp 16  SpO2 95%  LMP 07/09/2011 Physical Exam  Constitutional: She  is oriented to person, place, and time. She appears well-developed and well-nourished. No distress.  HENT:  Nose: Mucosal edema and rhinorrhea present.  Mouth/Throat: Oropharynx is clear and moist.  Eyes: Pupils are equal, round, and reactive to light.  Neck: Normal range of motion. Neck supple.  Cardiovascular: Normal heart sounds and intact distal pulses.   Pulmonary/Chest: Effort normal and breath sounds normal.  Lymphadenopathy:    She has no cervical adenopathy.  Neurological: She is alert and oriented to person, place, and time.  Skin: Skin is warm and dry.  Nursing note and vitals reviewed.   ED Course  Procedures (including critical care time) Labs Review Labs Reviewed - No data to display  Imaging  Review No results found.   MDM   1. Acute recurrent maxillary sinusitis   2. Bronchitis, allergic, mild intermittent, uncomplicated        Billy Fischer, MD 02/07/15 1756

## 2016-01-11 ENCOUNTER — Other Ambulatory Visit: Payer: Self-pay | Admitting: Family

## 2016-01-11 DIAGNOSIS — Z1231 Encounter for screening mammogram for malignant neoplasm of breast: Secondary | ICD-10-CM

## 2016-01-29 ENCOUNTER — Ambulatory Visit: Payer: BLUE CROSS/BLUE SHIELD | Admitting: Skilled Nursing Facility1

## 2017-04-27 ENCOUNTER — Ambulatory Visit (INDEPENDENT_AMBULATORY_CARE_PROVIDER_SITE_OTHER): Payer: Self-pay | Admitting: Orthopedic Surgery

## 2017-04-29 ENCOUNTER — Ambulatory Visit (INDEPENDENT_AMBULATORY_CARE_PROVIDER_SITE_OTHER): Payer: Self-pay | Admitting: Orthopedic Surgery

## 2017-04-29 ENCOUNTER — Ambulatory Visit (INDEPENDENT_AMBULATORY_CARE_PROVIDER_SITE_OTHER): Payer: Self-pay

## 2017-04-29 ENCOUNTER — Encounter (INDEPENDENT_AMBULATORY_CARE_PROVIDER_SITE_OTHER): Payer: Self-pay | Admitting: Orthopedic Surgery

## 2017-04-29 DIAGNOSIS — M79672 Pain in left foot: Secondary | ICD-10-CM

## 2017-04-29 DIAGNOSIS — M79671 Pain in right foot: Secondary | ICD-10-CM

## 2017-04-29 DIAGNOSIS — M7661 Achilles tendinitis, right leg: Secondary | ICD-10-CM

## 2017-04-29 DIAGNOSIS — M6702 Short Achilles tendon (acquired), left ankle: Secondary | ICD-10-CM

## 2017-04-29 DIAGNOSIS — M7662 Achilles tendinitis, left leg: Secondary | ICD-10-CM

## 2017-04-29 DIAGNOSIS — M6701 Short Achilles tendon (acquired), right ankle: Secondary | ICD-10-CM

## 2017-04-29 MED ORDER — NITROGLYCERIN 0.2 MG/HR TD PT24: MEDICATED_PATCH | TRANSDERMAL | 12 refills | Status: DC

## 2017-04-29 NOTE — Progress Notes (Addendum)
Office Visit Note   Patient: Kaitlyn Rodgers           Date of Birth: [birth year]           MRN: [id number] Visit Date: 04/29/2017              Requested by: Hayden Rasmussen, MD [address] [place], [place] [address] PCP: Hayden Rasmussen, MD  Chief Complaint  Patient presents with  . Right Foot - Pain  . 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  Patient states she is undergone steroid injections topical anti-inflammatories custom orthotics stretching night splints ultrasound all without relief.  Patient complains of pain with start up that does get better as she is up on her feet.  Patient states that she is type II diabetic currently taking metformin she states she is taking Neurontin for the possibility of her insertional Achilles tendinitis being a neuropathy.  Patient states she has sleep apnea asthma and depression.  She states her most recent hemoglobin A1c was 8.  BMI greater than 40.  Assessment & Plan: Visit Diagnoses:  1. Pain in left foot   2. Pain in right foot   3. Contracture of both Achilles tendons   4. Achilles tendinitis of both lower extremities     Plan: Patient is given instruction and demonstrated heel cord stretching to do 5 times a day a minute at a time this was demonstrated to her.  She will start with a nitroglycerin patch one half of patch topically to the affected area change in the location daily cleansing with soap and water.  She was given a 9/16 inch heel lift to wear in her shoes.  Reevaluate at follow-up.  Discussed that a gastrocnemius recession is an option but discussed that there are risks with surgery.  Follow-Up Instructions: Return in about 4 weeks (around 05/27/2017).   Ortho Exam  Patient is alert, oriented, no adenopathy, well-dressed, normal affect, normal respiratory effort. Examination patient has a slow antalgic gait she has good pulses.   She has dorsiflexion only to neutral with her knee extended bilaterally.  She has no tenderness to palpation over the origin of the plantar fascia bilaterally, lateral compression of the calcaneus is nontender, no evidence of a stress fracture.  The tarsal tunnel is nontender to palpation bilaterally.  Patient is point tender to palpation at the insertion of the Achilles bilaterally.  There is no redness no cellulitis no hypersensitivity to light touch.  Imaging: No results found. No images are attached to the encounter.  Labs: No results found for: HGBA1C, ESRSEDRATE, CRP, LABURIC, REPTSTATUS, GRAMSTAIN, CULT, LABORGA  Orders:  Orders Placed This Encounter  Procedures  . XR Foot Complete Right  . XR Foot Complete Left   No orders of the defined types were placed in this encounter.    Procedures: No procedures performed  Clinical Data: No additional findings.  ROS:  All other systems negative, except as noted in the HPI. Review of Systems  Objective: Vital Signs: LMP 07/09/2011   Specialty Comments:  No specialty comments available.  PMFS History: Patient Active Problem List   Diagnosis Date Noted  . DUB (dysfunctional uterine bleeding) 01/24/2011  . Thickened endometrium 01/24/2011   Past Medical History:  Diagnosis Date  . Abnormal vaginal bleeding   . Anemia   . Asthma    seasonal asthma-using inhaler daily at  present  . Complication of anesthesia    panic attack after breast biopsy 20 years ago  . DM II (diabetes mellitus, type II), controlled ([place])    metformin 500mg  qd  . Fibroids   . GERD (gastroesophageal reflux disease)    occasional indigestion-tums prn  . Heart murmur     Family History  Problem Relation Age of Onset  . Heart disease Mother   . Diabetes Maternal Grandmother     Past Surgical History:  Procedure Laterality Date  . ABDOMINAL HYSTERECTOMY    . BIOPSY BREAST  age 31   benign   . TUBAL LIGATION     Social History    Occupational History  . Not on file  Tobacco Use  . Smoking status: Never Smoker  . Smokeless tobacco: Never Used  Substance and Sexual Activity  . Alcohol use: No  . Drug use: No  . Sexual activity: Yes    Birth control/protection: Surgical

## 2017-06-02 ENCOUNTER — Ambulatory Visit (INDEPENDENT_AMBULATORY_CARE_PROVIDER_SITE_OTHER): Payer: Self-pay | Admitting: Orthopedic Surgery

## 2018-07-18 ENCOUNTER — Ambulatory Visit (HOSPITAL_COMMUNITY)
Admission: EM | Admit: 2018-07-18 | Discharge: 2018-07-18 | Disposition: A | Discharge status: Home / Self Care | Payer: PRIVATE HEALTH INSURANCE

## 2018-07-18 ENCOUNTER — Encounter (HOSPITAL_COMMUNITY): Payer: Self-pay | Admitting: *Deleted

## 2018-07-18 DIAGNOSIS — M25511 Pain in right shoulder: Secondary | ICD-10-CM | POA: Insufficient documentation

## 2018-07-18 HISTORY — DX: Rheumatoid arthritis, unspecified: M06.9

## 2018-07-18 MED ORDER — TRAMADOL HCL 50 MG PO TABS: 50.0000 mg | ORAL_TABLET | Freq: Four times a day (QID) | ORAL | 0 refills | Status: DC | PRN

## 2018-07-18 NOTE — ED Triage Notes (Signed)
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  Pain getting worse.  Denies parasthesias.

## 2018-07-18 NOTE — Discharge Instructions (Signed)
Please call Dr. Randel Pigg office and make an appointment.  Please call your primary care provider and make an appointment for FMLA.  Please keep your sling on at all times.  You can continue to take ice, ibuprofen, Tylenol, heat along with the tramadol.

## 2018-07-18 NOTE — ED Provider Notes (Signed)
07/18/2018 10:47 AM   DOB: 1975/12/20 / MRN: [id number]  SUBJECTIVE:  Kaitlyn Rodgers is a 43 y.o. female presenting for right-sided shoulder pain that started about 4 weeks ago after moving a heavy patient.  Since that time patient reports worsening right shoulder pain, and reports she is unable to lift the arm much past 90 degrees.  She also tells me that it is difficult to let the arm "hang" she is been using a sling along with ibuprofen and Tylenol but is not getting better.  She tells me that her work even when she wears the sling is intolerable secondary to pain.  She has No Known Allergies.   She  has a past medical history of Abnormal vaginal bleeding, Anemia, Asthma, Complication of anesthesia, DM II (diabetes mellitus, type II), controlled ([place]), Fibroids, GERD (gastroesophageal reflux disease), Heart murmur, and Rheumatoid arthritis ([place]).    She  reports that she has never smoked. She has never used smokeless tobacco. She reports that she does not drink alcohol or use drugs. She  reports being sexually active. She reports using the following method of birth control/protection: Surgical. The patient  has a past surgical history that includes Tubal ligation; Biopsy breast (age 22); Cystoscopy (10/13/2011); and Abdominal hysterectomy.  Her family history includes Diabetes in her maternal grandmother; Heart disease in her mother.  Review of Systems  Constitutional: Negative for chills, diaphoresis and fever.  Gastrointestinal: Negative for nausea.  Musculoskeletal: Positive for joint pain and myalgias.  Skin: Negative for rash.  Neurological: Negative for dizziness.    OBJECTIVE:  BP 127/82   Pulse 80   Temp (!) 97.4 F (36.3 C) (Oral)   Resp 18   LMP 07/09/2011   SpO2 99%   Wt Readings from Last 3 Encounters:  11/26/11 (!) 344 lb 8 oz (156.3 kg)  10/13/11 (!) 348 lb (157.9 kg)  10/06/11 (!) 348 lb (157.9 kg)   Temp Readings from Last 3 Encounters:  07/18/18 (!) 97.4 F  (36.3 C) (Oral)  02/07/15 98.7 F (37.1 C) (Oral)  04/11/14 98 F (36.7 C) (Oral)   BP Readings from Last 3 Encounters:  07/18/18 127/82  02/07/15 138/77  04/11/14 101/68   Pulse Readings from Last 3 Encounters:  07/18/18 80  02/07/15 102  04/11/14 67    Physical Exam Vitals signs reviewed.  Constitutional:      General: She is not in acute distress.    Appearance: She is not diaphoretic.  Eyes:     Pupils: Pupils are equal, round, and reactive to light.  Cardiovascular:     Rate and Rhythm: Normal rate.  Pulmonary:     Effort: Pulmonary effort is normal.  Abdominal:     General: There is no distension.  Musculoskeletal:        General: Tenderness (Shoulder diffusely tender.  Patient holds the arm in place with her left arm.) present.  Skin:    General: Skin is dry.  Neurological:     Mental Status: She is alert and oriented to person, place, and time.     Cranial Nerves: No cranial nerve deficit.     Gait: Gait normal.     No results found for this or any previous visit (from the past 72 hour(s)).  No results found.  ASSESSMENT AND PLAN:   Acute pain of right shoulder - Patient with diffuse tenderness about the right shoulder and in HPI consistent with a rotator cuff tear.  I like her to see Dr. Marlou Sa  as quickly as possible so he may evaluate her more thoroughly and consider imaging and surgical options.    Discharge Instructions     Please call Dr. Randel Pigg office and make an appointment.  Please call your primary care provider and make an appointment for FMLA.  Please keep your sling on at all times.  You can continue to take ice, ibuprofen, Tylenol, heat along with the tramadol.        The patient is advised to call or return to clinic if she does not see an improvement in symptoms, or to seek the care of the closest emergency department if she worsens with the above plan.   Philis Fendt, MHS, PA-C 07/18/2018 10:47 AM   Tereasa Coop,  PA-C 07/18/18 1047

## 2018-07-28 ENCOUNTER — Inpatient Hospital Stay (INDEPENDENT_AMBULATORY_CARE_PROVIDER_SITE_OTHER): Payer: Self-pay | Admitting: Orthopedic Surgery

## 2018-08-04 ENCOUNTER — Encounter (INDEPENDENT_AMBULATORY_CARE_PROVIDER_SITE_OTHER): Payer: Self-pay | Admitting: Orthopedic Surgery

## 2018-08-04 ENCOUNTER — Ambulatory Visit (INDEPENDENT_AMBULATORY_CARE_PROVIDER_SITE_OTHER): Payer: PRIVATE HEALTH INSURANCE

## 2018-08-04 ENCOUNTER — Ambulatory Visit (INDEPENDENT_AMBULATORY_CARE_PROVIDER_SITE_OTHER): Payer: PRIVATE HEALTH INSURANCE | Admitting: Orthopedic Surgery

## 2018-08-04 DIAGNOSIS — M25511 Pain in right shoulder: Secondary | ICD-10-CM

## 2018-08-04 DIAGNOSIS — M542 Cervicalgia: Secondary | ICD-10-CM

## 2018-08-06 NOTE — Progress Notes (Signed)
Office Visit Note   Patient: Kaitlyn Rodgers           Date of Birth: 05-01-1976           MRN: [id number] Visit Date: 08/04/2018 Requested by: Hayden Rasmussen, MD [address], [place] [address] PCP: Hayden Rasmussen, MD  Subjective: Chief Complaint  Patient presents with  . Neck - Pain  . Right Shoulder - Pain    HPI: Patient presents with right shoulder pain.  Date of injury October 2019.  She had the onset of shoulder pain after moving heavy patient.  She is trying to help her get up.  She tried Tylenol ibuprofen and a sling.  She was seen at [facility] urgent care 07/18/2018.  Reports pain in the shoulder and neck which radiates down to the elbow at times.  She is working now with restrictions and she has been in a sling.  Been taking gabapentin and tramadol and Mobic.  She was able to return to work a week ago.  She does describe having a diagnosis of rheumatoid arthritis.              ROS: All systems reviewed are negative as they relate to the chief complaint within the history of present illness.  Patient denies  fevers or chills.   Assessment & Plan: Visit Diagnoses:  1. Cervicalgia   2. Acute pain of right shoulder     Plan: Impression is right shoulder pain with pretty reasonable exam and radiographs.  Discontinue use of the sling and start physical therapy.  We will give this about another 4 weeks to improve.  She may need a subacromial bursa injection an MRI scan if her symptoms do not improve.  At this point however her exam looks pretty good in terms of rotator cuff strength and her radiographs also look good.  I do not want her to get a frozen shoulder.  Follow-up in 4 to 6 weeks if she is not improved.   Follow-Up Instructions: Return if symptoms worsen or fail to improve.   Orders:  Orders Placed This Encounter  Procedures  . XR Shoulder Right  . XR Cervical Spine 2 or 3 views  . Ambulatory referral to Physical Therapy   No orders of the  defined types were placed in this encounter.     Procedures: No procedures performed   Clinical Data: No additional findings.  Objective: Vital Signs: LMP 07/09/2011   Physical Exam:  Constitutional: Patient appears well-developed HEENT:  Head: Normocephalic Eyes:EOM are normal Neck: Normal range of motion Cardiovascular: Normal rate Pulmonary/chest: Effort normal Neurologic: Patient is alert Skin: Skin is warm Psychiatric: Patient has normal mood and affect   Ortho Exam: Ortho exam demonstrates good cervical spine range of motion.  She has full active and passive range of motion of that right shoulder including no restriction of external rotation 15 degrees of abduction.  Rotator cuff strength is good infraspinatus supraspinatus and subscap muscle testing.  No other masses lymphadenopathy or skin changes noted in that shoulder region.  Impingement signs positive on the right negative on the left.  Specialty Comments:  No specialty comments available.  Imaging: No results found.   PMFS History: Patient Active Problem List   Diagnosis Date Noted  . DUB (dysfunctional uterine bleeding) 01/24/2011  . Thickened endometrium 01/24/2011   Past Medical History:  Diagnosis Date  . Abnormal vaginal bleeding   . Anemia   . Asthma  seasonal asthma-using inhaler daily at present  . Complication of anesthesia    panic attack after breast biopsy 20 years ago  . DM II (diabetes mellitus, type II), controlled ([place])    metformin 500mg  qd  . Fibroids   . GERD (gastroesophageal reflux disease)    occasional indigestion-tums prn  . Heart murmur   . Rheumatoid arthritis ([place])     Family History  Problem Relation Age of Onset  . Heart disease Mother   . Diabetes Maternal Grandmother     Past Surgical History:  Procedure Laterality Date  . ABDOMINAL HYSTERECTOMY    . BIOPSY BREAST  age 11   benign   . CYSTOSCOPY  10/13/2011   Procedure: CYSTOSCOPY;  Surgeon: Emily Filbert, MD;  Location: [facility];  Service: Gynecology;  Laterality: N/A;  . TUBAL LIGATION     Social History   Occupational History  . Not on file  Tobacco Use  . Smoking status: Never Smoker  . Smokeless tobacco: Never Used  Substance and Sexual Activity  . Alcohol use: No  . Drug use: No  . Sexual activity: Yes    Birth control/protection: Surgical

## 2018-10-17 ENCOUNTER — Inpatient Hospital Stay (HOSPITAL_COMMUNITY)
Admission: EM | Admit: 2018-10-17 | Discharge: 2018-10-20 | DRG: 177 | Disposition: A | Discharge status: Home / Self Care | Payer: PRIVATE HEALTH INSURANCE | Attending: Internal Medicine | Admitting: Internal Medicine

## 2018-10-17 ENCOUNTER — Emergency Department (HOSPITAL_COMMUNITY): Payer: PRIVATE HEALTH INSURANCE

## 2018-10-17 ENCOUNTER — Encounter (HOSPITAL_COMMUNITY): Payer: Self-pay

## 2018-10-17 ENCOUNTER — Other Ambulatory Visit: Payer: Self-pay

## 2018-10-17 DIAGNOSIS — Z7984 Long term (current) use of oral hypoglycemic drugs: Secondary | ICD-10-CM

## 2018-10-17 DIAGNOSIS — J1289 Other viral pneumonia: Secondary | ICD-10-CM | POA: Diagnosis present

## 2018-10-17 DIAGNOSIS — Z90710 Acquired absence of both cervix and uterus: Secondary | ICD-10-CM

## 2018-10-17 DIAGNOSIS — Z79899 Other long term (current) drug therapy: Secondary | ICD-10-CM

## 2018-10-17 DIAGNOSIS — R0902 Hypoxemia: Secondary | ICD-10-CM

## 2018-10-17 DIAGNOSIS — K219 Gastro-esophageal reflux disease without esophagitis: Secondary | ICD-10-CM | POA: Diagnosis present

## 2018-10-17 DIAGNOSIS — J1282 Pneumonia due to coronavirus disease 2019: Secondary | ICD-10-CM | POA: Diagnosis present

## 2018-10-17 DIAGNOSIS — J189 Pneumonia, unspecified organism: Secondary | ICD-10-CM

## 2018-10-17 DIAGNOSIS — M069 Rheumatoid arthritis, unspecified: Secondary | ICD-10-CM | POA: Diagnosis present

## 2018-10-17 DIAGNOSIS — E1142 Type 2 diabetes mellitus with diabetic polyneuropathy: Secondary | ICD-10-CM | POA: Diagnosis present

## 2018-10-17 DIAGNOSIS — R6510 Systemic inflammatory response syndrome (SIRS) of non-infectious origin without acute organ dysfunction: Secondary | ICD-10-CM

## 2018-10-17 DIAGNOSIS — J45909 Unspecified asthma, uncomplicated: Secondary | ICD-10-CM | POA: Diagnosis present

## 2018-10-17 DIAGNOSIS — Z833 Family history of diabetes mellitus: Secondary | ICD-10-CM

## 2018-10-17 DIAGNOSIS — J9621 Acute and chronic respiratory failure with hypoxia: Secondary | ICD-10-CM

## 2018-10-17 DIAGNOSIS — E119 Type 2 diabetes mellitus without complications: Secondary | ICD-10-CM

## 2018-10-17 DIAGNOSIS — Z7951 Long term (current) use of inhaled steroids: Secondary | ICD-10-CM

## 2018-10-17 LAB — CBC WITH DIFFERENTIAL/PLATELET
Abs Immature Granulocytes: 0.07 10*3/uL (ref 0.00–0.07)
Basophils Absolute: 0 10*3/uL (ref 0.0–0.1)
Basophils Relative: 0 %
Eosinophils Absolute: 0 10*3/uL (ref 0.0–0.5)
Eosinophils Relative: 0 %
HCT: 41.4 % (ref 36.0–46.0)
Hemoglobin: 12.7 g/dL (ref 12.0–15.0)
Immature Granulocytes: 1 %
Lymphocytes Relative: 20 %
Lymphs Abs: 1.9 10*3/uL (ref 0.7–4.0)
MCH: 27.3 pg (ref 26.0–34.0)
MCHC: 30.7 g/dL (ref 30.0–36.0)
MCV: 88.8 fL (ref 80.0–100.0)
Monocytes Absolute: 0.5 10*3/uL (ref 0.1–1.0)
Monocytes Relative: 5 %
Neutro Abs: 7.1 10*3/uL (ref 1.7–7.7)
Neutrophils Relative %: 74 %
Platelets: 403 10*3/uL — ABNORMAL HIGH (ref 150–400)
RBC: 4.66 MIL/uL (ref 3.87–5.11)
RDW: 13.7 % (ref 11.5–15.5)
WBC: 9.5 10*3/uL (ref 4.0–10.5)
nRBC: 0 % (ref 0.0–0.2)

## 2018-10-17 LAB — PROCALCITONIN: Procalcitonin: <0.1 ng/mL

## 2018-10-17 LAB — COMPREHENSIVE METABOLIC PANEL
ALT: 32 U/L (ref 0–44)
AST: 38 U/L (ref 15–41)
Albumin: 3 g/dL — ABNORMAL LOW (ref 3.5–5.0)
Alkaline Phosphatase: 78 U/L (ref 38–126)
Anion gap: 14 (ref 5–15)
BUN: 9 mg/dL (ref 6–20)
CO2: 20 mmol/L — ABNORMAL LOW (ref 22–32)
Calcium: 8.1 mg/dL — ABNORMAL LOW (ref 8.9–10.3)
Chloride: 99 mmol/L (ref 98–111)
Creatinine, Ser: 0.62 mg/dL (ref 0.44–1.00)
GFR calc Af Amer: >60 mL/min (ref >60)
GFR calc non Af Amer: >60 mL/min (ref >60)
Glucose, Bld: 218 mg/dL — ABNORMAL HIGH (ref 70–99)
Potassium: 3.3 mmol/L — ABNORMAL LOW (ref 3.5–5.1)
Sodium: 133 mmol/L — ABNORMAL LOW (ref 135–145)
Total Bilirubin: 0.7 mg/dL (ref 0.3–1.2)
Total Protein: 7.5 g/dL (ref 6.5–8.1)

## 2018-10-17 LAB — FIBRINOGEN: Fibrinogen: 670 mg/dL — ABNORMAL HIGH (ref 210–475)

## 2018-10-17 LAB — LACTATE DEHYDROGENASE: LDH: 294 U/L — ABNORMAL HIGH (ref 98–192)

## 2018-10-17 LAB — LACTIC ACID, PLASMA: Lactic Acid, Venous: 1.3 mmol/L (ref 0.5–1.9)

## 2018-10-17 LAB — FERRITIN: Ferritin: 193 ng/mL (ref 11–307)

## 2018-10-17 LAB — TRIGLYCERIDES: Triglycerides: 177 mg/dL — ABNORMAL HIGH (ref <150)

## 2018-10-17 LAB — GLUCOSE, CAPILLARY: Glucose-Capillary: 197 mg/dL — ABNORMAL HIGH (ref 70–99)

## 2018-10-17 LAB — C-REACTIVE PROTEIN: CRP: 14.9 mg/dL — ABNORMAL HIGH (ref <1.0)

## 2018-10-17 LAB — D-DIMER, QUANTITATIVE (NOT AT ARMC): D-Dimer, Quant: 0.65 ug/mL-FEU — ABNORMAL HIGH (ref 0.00–0.50)

## 2018-10-17 MED ORDER — ONDANSETRON HCL 4 MG PO TABS: 4.0000 mg | ORAL_TABLET | Freq: Four times a day (QID) | ORAL | Status: DC | PRN

## 2018-10-17 MED ORDER — SORBITOL 70 % SOLN
30.0000 mL | Freq: Every day | Status: DC | PRN
  Filled 2018-10-17: qty 30

## 2018-10-17 MED ORDER — SUCRALFATE 1 G PO TABS
1.0000 g | ORAL_TABLET | Freq: Two times a day (BID) | ORAL | Status: DC
  Administered 2018-10-17 – 2018-10-20 (×6): 1 g via ORAL
  Filled 2018-10-17 (×8): qty 1

## 2018-10-17 MED ORDER — METFORMIN HCL 500 MG PO TABS: 500.0000 mg | ORAL_TABLET | Freq: Every day | ORAL | Status: DC

## 2018-10-17 MED ORDER — GUAIFENESIN-DM 100-10 MG/5ML PO SYRP
10.0000 mL | ORAL_SOLUTION | ORAL | Status: DC | PRN
  Administered 2018-10-19: 18:00:00 10 mL via ORAL
  Filled 2018-10-17 (×2): qty 10

## 2018-10-17 MED ORDER — CANAGLIFLOZIN 100 MG PO TABS
100.0000 mg | ORAL_TABLET | Freq: Every day | ORAL | Status: DC
  Filled 2018-10-17: qty 1

## 2018-10-17 MED ORDER — ZINC SULFATE 220 (50 ZN) MG PO CAPS
220.0000 mg | ORAL_CAPSULE | Freq: Every day | ORAL | Status: DC
  Administered 2018-10-17 – 2018-10-20 (×4): 220 mg via ORAL
  Filled 2018-10-17 (×4): qty 1

## 2018-10-17 MED ORDER — GABAPENTIN 300 MG PO CAPS
300.0000 mg | ORAL_CAPSULE | Freq: Three times a day (TID) | ORAL | Status: DC
  Administered 2018-10-17 – 2018-10-20 (×9): 300 mg via ORAL
  Filled 2018-10-17 (×10): qty 1

## 2018-10-17 MED ORDER — INSULIN ASPART 100 UNIT/ML ~~LOC~~ SOLN: 0.0000 [IU] | Freq: Three times a day (TID) | SUBCUTANEOUS | Status: DC

## 2018-10-17 MED ORDER — ENOXAPARIN SODIUM 80 MG/0.8ML ~~LOC~~ SOLN
70.0000 mg | SUBCUTANEOUS | Status: DC
  Administered 2018-10-17: 21:00:00 70 mg via SUBCUTANEOUS
  Filled 2018-10-17: qty 0.8

## 2018-10-17 MED ORDER — SODIUM CHLORIDE 0.9 % IV SOLN
1.0000 g | Freq: Once | INTRAVENOUS | Status: AC
  Administered 2018-10-17: 1 g via INTRAVENOUS
  Filled 2018-10-17: qty 10

## 2018-10-17 MED ORDER — FLUTICASONE FUROATE-VILANTEROL 200-25 MCG/INH IN AEPB
1.0000 | INHALATION_SPRAY | Freq: Every day | RESPIRATORY_TRACT | Status: DC
  Administered 2018-10-18 – 2018-10-20 (×3): 1 via RESPIRATORY_TRACT
  Filled 2018-10-17: qty 28

## 2018-10-17 MED ORDER — SENNA 8.6 MG PO TABS
1.0000 | ORAL_TABLET | Freq: Two times a day (BID) | ORAL | Status: DC
  Administered 2018-10-17 – 2018-10-19 (×5): 8.6 mg via ORAL
  Filled 2018-10-17 (×6): qty 1

## 2018-10-17 MED ORDER — ONDANSETRON HCL 4 MG/2ML IJ SOLN: 4.0000 mg | Freq: Four times a day (QID) | INTRAMUSCULAR | Status: DC | PRN

## 2018-10-17 MED ORDER — MAGNESIUM HYDROXIDE 400 MG/5ML PO SUSP: 30.0000 mL | Freq: Every day | ORAL | Status: DC | PRN

## 2018-10-17 MED ORDER — VITAMIN C 500 MG PO TABS
500.0000 mg | ORAL_TABLET | Freq: Every day | ORAL | Status: DC
  Administered 2018-10-17 – 2018-10-20 (×4): 500 mg via ORAL
  Filled 2018-10-17 (×4): qty 1

## 2018-10-17 MED ORDER — FLEET ENEMA 7-19 GM/118ML RE ENEM: 1.0000 | ENEMA | Freq: Once | RECTAL | Status: DC | PRN

## 2018-10-17 MED ORDER — ACETAMINOPHEN 325 MG PO TABS
650.0000 mg | ORAL_TABLET | Freq: Four times a day (QID) | ORAL | Status: DC | PRN
  Administered 2018-10-17 – 2018-10-18 (×3): 650 mg via ORAL
  Filled 2018-10-17 (×3): qty 2

## 2018-10-17 MED ORDER — SODIUM CHLORIDE 0.9 % IV BOLUS
500.0000 mL | Freq: Once | INTRAVENOUS | Status: AC
  Administered 2018-10-17: 500 mL via INTRAVENOUS

## 2018-10-17 MED ORDER — INSULIN ASPART 100 UNIT/ML ~~LOC~~ SOLN: 0.0000 [IU] | Freq: Every day | SUBCUTANEOUS | Status: DC

## 2018-10-17 MED ORDER — ACETAMINOPHEN 650 MG RE SUPP: 650.0000 mg | Freq: Four times a day (QID) | RECTAL | Status: DC | PRN

## 2018-10-17 MED ORDER — ACETAMINOPHEN 325 MG PO TABS
650.0000 mg | ORAL_TABLET | Freq: Once | ORAL | Status: AC
  Administered 2018-10-17: 14:00:00 650 mg via ORAL
  Filled 2018-10-17: qty 2

## 2018-10-17 MED ORDER — AZITHROMYCIN 250 MG PO TABS
500.0000 mg | ORAL_TABLET | Freq: Once | ORAL | Status: AC
  Administered 2018-10-17: 16:00:00 500 mg via ORAL
  Filled 2018-10-17: qty 2

## 2018-10-17 MED ORDER — HYDROCOD POLST-CPM POLST ER 10-8 MG/5ML PO SUER
5.0000 mL | Freq: Two times a day (BID) | ORAL | Status: DC | PRN
  Administered 2018-10-17 – 2018-10-19 (×4): 5 mL via ORAL
  Filled 2018-10-17 (×4): qty 5

## 2018-10-17 MED ORDER — ORAL CARE MOUTH RINSE
15.0000 mL | Freq: Two times a day (BID) | OROMUCOSAL | Status: DC
  Administered 2018-10-18 – 2018-10-20 (×4): 15 mL via OROMUCOSAL

## 2018-10-17 MED ORDER — MONTELUKAST SODIUM 10 MG PO TABS
10.0000 mg | ORAL_TABLET | Freq: Every day | ORAL | Status: DC
  Administered 2018-10-17 – 2018-10-19 (×3): 10 mg via ORAL
  Filled 2018-10-17 (×2): qty 1

## 2018-10-17 MED ORDER — ONDANSETRON HCL 4 MG/2ML IJ SOLN
4.0000 mg | Freq: Once | INTRAMUSCULAR | Status: AC
  Administered 2018-10-17: 14:00:00 4 mg via INTRAVENOUS
  Filled 2018-10-17: qty 2

## 2018-10-17 MED ORDER — SODIUM CHLORIDE 0.9% FLUSH
3.0000 mL | Freq: Two times a day (BID) | INTRAVENOUS | Status: DC
  Administered 2018-10-17 – 2018-10-19 (×5): 3 mL via INTRAVENOUS
  Administered 2018-10-20: 09:00:00 10 mL via INTRAVENOUS

## 2018-10-17 NOTE — H&P (Signed)
[facility] History and Physical  Kaitlyn Rodgers VFI:[id number] DOB: May 13, 1976 DOA: 10/17/2018 Referring physician: ED PCP: Hayden Rasmussen, MD  Chief Complaint: Shortness of breath, cough, fever ------------------------------------------------------------------------------------------------------ Assessment/Plan: Active Problems:   Pneumonia due to COVID-19 virus  COVID-19 pneumonia Date: @TODAY @ 6:03 PM  Patient Isolation: Droplet/Contact HCP PPE: Wearing all recommended PPE N95 mask + eye protection + Gown + Gloves + Surgical Cap Patient PPE: facemask  - Currently low risk on 4 L oxygen via nasal cannula at this time.  - Chest x-ray showed low lung volumes with patchy right greater than left largely peripheral infiltrates, consistent with atypical/viral pneumonia. - LDH, Ferritin, d-dimer, procalcitonin, Troponin, CRP levels obtained. LDH level elevated to 94, ferritin level normal at 193, CRP elevated to 14.9, lactic acid level normal at 1.3, procalcitonin level normal at <0.1, d-dimer slightly elevated 0.65, fibrinogen elevated to 670. - Ordered for next 3 days as well. - Monitor in COVID unit.  - Droplet/contact isolation - Start antitussives and inhalers. - Keep prone for 16 hours a day. - We will continue to monitor respiratory symptoms. LDH level elevated to 94, ferritin level normal at 193, CRP elevated to 14.9, lactic acid level normal at 1.3, procalcitonin level normal at <0.1, d-dimer slightly elevated 0.65, fibrinogen elevated to 670.  Diabetes mellitus -Prior to admission, patient was not Jardiance, metformin, Lantus.  -Start sliding scale insulin with Accu-Cheks.-Hemoglobin A1c.  Continue Jardiance and metformin.  Rheumatoid arthritis  -Not on any immunosuppression or immunomodulators.  History of asthma -Continue Symbicort and Singulair.  Mobility: Encourage ambulation inside the room Diet: Diabetic diet DVT prophylaxis:  Lovenox Code Status:  Full  code Disposition Plan:  Pending clinical course  ----------------------------------------------------------------------------------------------------- History of Present Illness: Kaitlyn Rodgers is a 43 y.o. female with history of type 2 diabetes mellitus, rheumatoid arthritis, anemia, GERD who works weekends at [employer] where they recently had an outbreak of COVID-19. She last worked there 2 weekends ago. For last 1 week, patient is having shortness of breath, cough and fever.  Fever as high as 103.  She reports that went to her primary care provider few days ago, and tested positive for COVID-19.  Her symptoms continue to get worse at home and she decided to come to the ED today.  She lives with her husband and child were not exhibiting any symptoms.  She has a history of asthma and has recently been started on Symbicort.  She is not on any immunomodulating medications for rheumatoid arthritis.  In the ED, she was febrile to 101, heart rate between 100-110. Blood pressure normal range.   Her oxygen saturation was 84% on room air which improved to more than 90% with 4 L oxygen by nasal cannula. Labs showed WBC count normal at 9.5, sodium level slightly low at 133, potassium low at 3.3. LDH level elevated to 94, ferritin level normal at 193, CRP elevated to 14.9, lactic acid level normal at 1.3, procalcitonin level normal at <0.1, d-dimer slightly elevated 0.65, fibrinogen elevated to 670. Chest x-ray showed low lung volumes with patchy right greater than left largely peripheral infiltrates, consistent with atypical/viral pneumonia.  Review of Systems:  All systems were reviewed and were negative unless otherwise mentioned in the HPI   Past medical history: Past Medical History:  Diagnosis Date  . Abnormal vaginal bleeding   . Anemia   . Asthma    seasonal asthma-using inhaler daily at present  . Complication of anesthesia    panic attack after breast  biopsy 20 years ago  . DM II  (diabetes mellitus, type II), controlled ([place])    metformin 500mg  qd  . Fibroids   . GERD (gastroesophageal reflux disease)    occasional indigestion-tums prn  . Heart murmur   . Rheumatoid arthritis ([place])     Past surgical history: Past Surgical History:  Procedure Laterality Date  . ABDOMINAL HYSTERECTOMY    . BIOPSY BREAST  age 63   benign   . CYSTOSCOPY  10/13/2011   Procedure: CYSTOSCOPY;  Surgeon: Emily Filbert, MD;  Location: [facility];  Service: Gynecology;  Laterality: N/A;  . TUBAL LIGATION      Social History:  reports that she has never smoked. She has never used smokeless tobacco. She reports that she does not drink alcohol or use drugs.  Allergies:  No Known Allergies  Family history:  Family History  Problem Relation Age of Onset  . Heart disease Mother   . Diabetes Maternal Grandmother      Home Meds: Prior to Admission medications   Medication Sig Start Date End Date Taking? Authorizing Provider  budesonide-formoterol (SYMBICORT) 160-4.5 MCG/ACT inhaler Inhale 2 puffs into the lungs 2 (two) times daily.   Yes [provider]  chlorhexidine (PERIDEX) 0.12 % solution Use as directed 15 mLs in the mouth or throat 2 (two) times daily.   Yes [provider]  empagliflozin (JARDIANCE) 25 MG TABS tablet Take 25 mg by mouth daily.   Yes [provider]  fluticasone (FLONASE) 50 MCG/ACT nasal spray Place 1 spray into both nostrils daily.   Yes [provider]  gabapentin (NEURONTIN) 300 MG capsule Take 300 mg by mouth 3 (three) times daily.   Yes [provider]  metFORMIN (GLUCOPHAGE) 500 MG tablet Take 500 mg by mouth daily.   Yes [provider]  montelukast (SINGULAIR) 10 MG tablet Take 10 mg by mouth daily.   Yes [provider]  sucralfate (CARAFATE) 1 g tablet Take 1 g by mouth 2 (two) times daily.   Yes [provider]  Insulin Glargine (BASAGLAR KWIKPEN) 100 UNIT/ML SOPN Inject into the skin  daily.    [provider]    Physical Exam: Vitals:   10/17/18 1600 10/17/18 1630 10/17/18 1700 10/17/18 1730  BP: 131/70 130/84 119/86 121/70  Pulse: 100 100 (!) 101 100  Resp: (!) 31 (!) 29 (!) 29 (!) 22  Temp:      TempSrc:      SpO2: 99% 94% 93% 94%  Weight:      Height:       Wt Readings from Last 3 Encounters:  10/17/18 (!) 145.2 kg  11/26/11 (!) 156.3 kg  10/13/11 (!) 157.9 kg   Body mass index is 54.93 kg/m.  General exam: Propped up in bed.  Not in distress on oxygen by nasal cannula.  Coughing intermittently. Skin: No rashes, lesions or ulcers. HEENT: Normal Lungs: Clear to auscultation bilaterally CVS: Regular rate and rhythm, no murmur GI/Abd soft, nontender, nondistended, bowel sound present CNS: Alert, awake, oriented x3 Psychiatry: Mood & affect appropriate.  Extremities: No pedal edema, no calf tenderness  Labs on Admission:   CBC: Recent Labs  Lab 10/17/18 1357  WBC 9.5  NEUTROABS 7.1  HGB 12.7  HCT 41.4  MCV 88.8  PLT 403*    Basic Metabolic Panel: Recent Labs  Lab 10/17/18 1357  NA 133*  K 3.3*  CL 99  CO2 20*  GLUCOSE 218*  BUN 9  CREATININE 0.62  CALCIUM 8.1*    Liver Function Tests: Recent Labs  Lab 10/17/18 1357  AST 38  ALT 32  ALKPHOS 78  BILITOT 0.7  PROT 7.5  ALBUMIN 3.0*   No results for input(s): LIPASE, AMYLASE in the last 168 hours. No results for input(s): AMMONIA in the last 168 hours.  Cardiac Enzymes: No results for input(s): CKTOTAL, CKMB, CKMBINDEX, TROPONINI in the last 168 hours.  BNP (last 3 results) No results for input(s): BNP in the last 8760 hours.  ProBNP (last 3 results) No results for input(s): PROBNP in the last 8760 hours.  CBG: No results for input(s): GLUCAP in the last 168 hours.  Lipase  No results found for: LIPASE   Urinalysis    Component Value Date/Time   COLORURINE RED (A) 01/24/2011 1926   APPEARANCEUR CLOUDY (A) 01/24/2011 1926   LABSPEC >1.030 (H)  01/24/2011 1926   PHURINE 5.5 01/24/2011 1926   GLUCOSEU NEGATIVE 01/24/2011 1926   HGBUR LARGE (A) 01/24/2011 1926   BILIRUBINUR SMALL (A) 01/24/2011 1926   KETONESUR NEGATIVE 01/24/2011 1926   PROTEINUR 100 (A) 01/24/2011 1926   UROBILINOGEN 1.0 01/24/2011 1926   NITRITE POSITIVE (A) 01/24/2011 1926   LEUKOCYTESUR TRACE (A) 01/24/2011 1926     Drugs of Abuse  No results found for: LABOPIA, COCAINSCRNUR, LABBENZ, AMPHETMU, THCU, LABBARB    Radiological Exams on Admission: Dg Chest Port 1 View  Result Date: 10/17/2018 CLINICAL DATA:  Fever and short of breath, COVID-19 positive EXAM: PORTABLE CHEST 1 VIEW COMPARISON:  None. FINDINGS: Low lung volumes. Patchy peripheral right greater than left opacity. Patchy left basilar opacity. No pleural effusion. Heart size upper normal. No pneumothorax. IMPRESSION: Low lung volumes with patchy right greater than left largely peripheral infiltrates, consistent with atypical/viral pneumonia Electronically Signed   By: Donavan Foil M.D.   On: 10/17/2018 15:13   ----------------------------------------------------------------------------------------------------------------------------------------------------------- Severity of Illness: The appropriate patient status for this patient is INPATIENT. Inpatient status is judged to be reasonable and necessary in order to provide the required intensity of service to ensure the patient's safety. The patient's presenting symptoms, physical exam findings, and initial radiographic and laboratory data in the context of their chronic comorbidities is felt to place them at high risk for further clinical deterioration. Furthermore, it is not anticipated that the patient will be medically stable for discharge from the hospital within 2 midnights of admission. The following factors support the patient status of inpatient.   " The patient's presenting symptoms include shortness of breath, cough, fever. " The worrisome  physical exam findings include hypoxia. " The initial radiographic and laboratory data are worrisome because of COVID-19 test positive. " The chronic co-morbidities include diabetes mellitus, rheumatoid arthritis.   * I certify that at the point of admission it is my clinical judgment that the patient will require inpatient hospital care spanning beyond 2 midnights from the point of admission due to high intensity of service, high risk for further deterioration and high frequency of surveillance required.*   Signed, Terrilee Croak, MD [facility] 10/17/2018

## 2018-10-17 NOTE — Plan of Care (Signed)
  Problem: Education: Goal: Knowledge of General Education information will improve Description Including pain rating scale, medication(s)/side effects and non-pharmacologic comfort measures Outcome: Progressing   Problem: Health Behavior/Discharge Planning: Goal: Ability to manage health-related needs will improve Outcome: Progressing   Problem: Nutrition: Goal: Adequate nutrition will be maintained Outcome: Progressing   Problem: Elimination: Goal: Will not experience complications related to urinary retention Outcome: Progressing   Problem: Pain Managment: Goal: General experience of comfort will improve Outcome: Progressing   Problem: Safety: Goal: Ability to remain free from injury will improve Outcome: Progressing   Problem: Skin Integrity: Goal: Risk for impaired skin integrity will decrease Outcome: Progressing   Problem: Respiratory: Goal: Will maintain a patent airway Outcome: Progressing

## 2018-10-17 NOTE — ED Provider Notes (Signed)
[facility] DEPT Provider Note   CSN: [id number] Arrival date & time: 10/17/18  1331    History   Chief Complaint Chief Complaint  Patient presents with   Shortness of Breath   Emesis    HPI Kaitlyn Rodgers is a 43 y.o. female.  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  She said she has been sick for about 9 days with cough intermittent fever malaise decreased appetite weakness shortness of breath.  She saw her PCP a few days ago and was tested for Covid and found that she was positive yesterday.  She continues to have fevers and shortness of breath along with cough and elected to come here for evaluation today.  She lives with her husband and child who are not exhibiting any symptoms.  She has a history of asthma and has recently been started on Symbicort.  She does not use home oxygen.  She also has a history of diabetes and rheumatoid arthritis but is not on any immune modulating medications.     The history is provided by the patient.  Shortness of Breath  Severity:  Moderate Onset quality:  Gradual Duration:  8 days Timing:  Constant Progression:  Worsening Chronicity:  New Relieved by:  None tried Worsened by:  Activity Ineffective treatments:  Inhaler Associated symptoms: cough, fever, headaches, sore throat and vomiting   Associated symptoms: no abdominal pain, no chest pain, no hemoptysis, no neck pain, no rash and no sputum production   Emesis  Associated symptoms: cough, fever, headaches and sore throat   Associated symptoms: no abdominal pain     Past Medical History:  Diagnosis Date   Abnormal vaginal bleeding    Anemia    Asthma    seasonal asthma-using inhaler daily at present   Complication of anesthesia    panic attack after breast biopsy 20 years ago   DM II (diabetes mellitus, type II), controlled (HCC)    metformin 500mg  qd   Fibroids    GERD (gastroesophageal  reflux disease)    occasional indigestion-tums prn   Heart murmur    Rheumatoid arthritis [facility])     Patient Active Problem List   Diagnosis Date Noted   DUB (dysfunctional uterine bleeding) 01/24/2011   Thickened endometrium 01/24/2011    Past Surgical History:  Procedure Laterality Date   ABDOMINAL HYSTERECTOMY     BIOPSY BREAST  age 5   benign    CYSTOSCOPY  10/13/2011   Procedure: CYSTOSCOPY;  Surgeon: Emily Filbert, MD;  Location: [facility];  Service: Gynecology;  Laterality: N/A;   TUBAL LIGATION       OB History    Gravida  5   Para  4   Term  4   Preterm      AB  1   Living  4     SAB      TAB  1   Ectopic      Multiple      Live Births               Home Medications    Prior to Admission medications   Medication Sig Start Date End Date Taking? Authorizing Provider  doxycycline (VIBRAMYCIN) 100 MG capsule Take 1 capsule (100 mg total) by mouth 2 (two) times daily. Patient not taking: Reported on 08/04/2018 02/07/15   Billy Fischer, MD  ferrous sulfate 325 (65 FE) MG tablet Take  325 mg by mouth daily with breakfast.    [provider]  fish oil-omega-3 fatty acids 1000 MG capsule Take 2 g by mouth daily.    [provider]  gabapentin (NEURONTIN) 300 MG capsule Take 300 mg by mouth 3 (three) times daily.    [provider]  ipratropium (ATROVENT) 0.06 % nasal spray Place 2 sprays into both nostrils 4 (four) times daily. 02/07/15   Billy Fischer, MD  meclizine (ANTIVERT) 25 MG tablet Take 1 tablet (25 mg total) by mouth 3 (three) times daily as needed for dizziness. 09/25/13   Harden Mo, MD  meloxicam (MOBIC) 15 MG tablet Take 15 mg by mouth daily.    [provider]  metFORMIN (GLUCOPHAGE) 500 MG tablet Take 500 mg by mouth daily.    [provider]  nitroGLYCERIN (NITRODUR - DOSED IN MG/24 HR) 0.2 mg/hr patch Place one half patch to the affected area on both heels and change daily 04/29/17    Newt Minion, MD  predniSONE (DELTASONE) 50 MG tablet Take 1 pill daily for 5 days. Patient not taking: Reported on 08/04/2018 04/11/14   Melony Overly, MD  traMADol (ULTRAM) 50 MG tablet Take 1-2 tablets (50-100 mg total) by mouth every 6 (six) hours as needed for up to 40 doses. 07/18/18   Tereasa Coop, PA-C    Family History Family History  Problem Relation Age of Onset   Heart disease Mother    Diabetes Maternal Grandmother     Social History Social History   Tobacco Use   Smoking status: Never Smoker   Smokeless tobacco: Never Used  Substance Use Topics   Alcohol use: No   Drug use: No     Allergies   Patient has no known allergies.   Review of Systems Review of Systems  Constitutional: Positive for fever.  HENT: Positive for sore throat.   Eyes: Negative for visual disturbance.  Respiratory: Positive for cough and shortness of breath. Negative for hemoptysis and sputum production.   Cardiovascular: Negative for chest pain.  Gastrointestinal: Positive for vomiting. Negative for abdominal pain.  Genitourinary: Negative for dysuria.  Musculoskeletal: Negative for neck pain.  Skin: Negative for rash.  Neurological: Positive for headaches.     Physical Exam Updated Vital Signs BP 135/70 (BP Location: Right Arm)    Pulse (!) 105    Temp (!) 101 F (38.3 C) (Oral)    Resp (!) 30    Ht 5\' 4"  (1.626 m)    Wt (!) 145.2 kg    LMP 07/09/2011    SpO2 96%    BMI 54.93 kg/m   Physical Exam Vitals signs and nursing note reviewed.  Constitutional:      General: She is not in acute distress.    Appearance: She is well-developed.  HENT:     Head: Normocephalic and atraumatic.  Eyes:     Conjunctiva/sclera: Conjunctivae normal.  Neck:     Musculoskeletal: Neck supple.  Cardiovascular:     Rate and Rhythm: Regular rhythm. Tachycardia present.     Heart sounds: No murmur.  Pulmonary:     Effort: Pulmonary effort is normal. Tachypnea present. No respiratory  distress.     Breath sounds: Normal breath sounds.  Abdominal:     Palpations: Abdomen is soft.     Tenderness: There is no abdominal tenderness.  Musculoskeletal:     Right lower leg: She exhibits no tenderness.     Left lower leg:  She exhibits no tenderness.  Skin:    General: Skin is warm and dry.     Capillary Refill: Capillary refill takes less than 2 seconds.  Neurological:     General: No focal deficit present.     Mental Status: She is alert and oriented to person, place, and time.     Motor: No weakness.      ED Treatments / Results  Labs (all labs ordered are listed, but only abnormal results are displayed) Labs Reviewed  CBC WITH DIFFERENTIAL/PLATELET - Abnormal; Notable for the following components:      Result Value   Platelets 403 (*)    All other components within normal limits  COMPREHENSIVE METABOLIC PANEL - Abnormal; Notable for the following components:   Sodium 133 (*)    Potassium 3.3 (*)    CO2 20 (*)    Glucose, Bld 218 (*)    Calcium 8.1 (*)    Albumin 3.0 (*)    All other components within normal limits  D-DIMER, QUANTITATIVE (NOT AT [facility]) - Abnormal; Notable for the following components:   D-Dimer, Quant 0.65 (*)    All other components within normal limits  LACTATE DEHYDROGENASE - Abnormal; Notable for the following components:   LDH 294 (*)    All other components within normal limits  TRIGLYCERIDES - Abnormal; Notable for the following components:   Triglycerides 177 (*)    All other components within normal limits  FIBRINOGEN - Abnormal; Notable for the following components:   Fibrinogen 670 (*)    All other components within normal limits  C-REACTIVE PROTEIN - Abnormal; Notable for the following components:   CRP 14.9 (*)    All other components within normal limits  HEMOGLOBIN A1C - Abnormal; Notable for the following components:   Hgb A1c MFr Bld 11.4 (*)    All other components within normal limits  BASIC METABOLIC PANEL -  Abnormal; Notable for the following components:   Glucose, Bld 213 (*)    Calcium 8.4 (*)    All other components within normal limits  CBC - Abnormal; Notable for the following components:   WBC 10.9 (*)    All other components within normal limits  C-REACTIVE PROTEIN - Abnormal; Notable for the following components:   CRP 18.2 (*)    All other components within normal limits  D-DIMER, QUANTITATIVE (NOT AT [facility]) - Abnormal; Notable for the following components:   D-Dimer, Quant 0.77 (*)    All other components within normal limits  GLUCOSE, CAPILLARY - Abnormal; Notable for the following components:   Glucose-Capillary 197 (*)    All other components within normal limits  GLUCOSE, CAPILLARY - Abnormal; Notable for the following components:   Glucose-Capillary 179 (*)    All other components within normal limits  LACTIC ACID, PLASMA  PROCALCITONIN  FERRITIN  FERRITIN  PROCALCITONIN  HIV ANTIBODY (ROUTINE TESTING W REFLEX)    EKG EKG Interpretation  Date/Time:  Sunday October 17 2018 13:38:11 EDT Ventricular Rate:  104 PR Interval:    QRS Duration: 83 QT Interval:  349 QTC Calculation: 459 R Axis:   47 Text Interpretation:  Sinus tachycardia no prior to compare with Confirmed by Aletta Edouard [phone]) on 10/17/2018 1:47:37 PM   Radiology Dg Chest Port 1 View  Result Date: 10/17/2018 CLINICAL DATA:  Fever and short of breath, COVID-19 positive EXAM: PORTABLE CHEST 1 VIEW COMPARISON:  None. FINDINGS: Low lung volumes. Patchy peripheral right greater than left opacity. Patchy left basilar  opacity. No pleural effusion. Heart size upper normal. No pneumothorax. IMPRESSION: Low lung volumes with patchy right greater than left largely peripheral infiltrates, consistent with atypical/viral pneumonia Electronically Signed   By: Donavan Foil M.D.   On: 10/17/2018 15:13    Procedures .Critical Care Performed by: Hayden Rasmussen, MD Authorized by: Hayden Rasmussen, MD    Critical care provider statement:    Critical care time (minutes):  45   Critical care time was exclusive of:  Separately billable procedures and treating other patients   Critical care was necessary to treat or prevent imminent or life-threatening deterioration of the following conditions:  Respiratory failure   Critical care was time spent personally by me on the following activities:  Discussions with consultants, evaluation of patient's response to treatment, examination of patient, ordering and performing treatments and interventions, ordering and review of laboratory studies, ordering and review of radiographic studies, pulse oximetry, re-evaluation of patient's condition, obtaining history from patient or surrogate, review of old charts and development of treatment plan with patient or surrogate   I assumed direction of critical care for this patient from another provider in my specialty: no     (including critical care time)  Medications Ordered in ED Medications  sodium chloride 0.9 % bolus 500 mL (has no administration in time range)  ondansetron (ZOFRAN) injection 4 mg (has no administration in time range)     Initial Impression / Assessment and Plan / ED Course  I have reviewed the triage vital signs and the nursing notes.  Pertinent labs & imaging results that were available during my care of the patient were reviewed by me and considered in my medical decision making (see chart for details).  Clinical Course as of Oct 18 1135  Sun Oct 17, 2018  1542 Known Covid +43 year old female here with increased shortness of breath and cough.  She is febrile here tachycardic tachypneic.  Her chest x-ray shows multifocal pneumonia.  Differential diagnosis includes Covid pneumonia, bacterial pneumonia, sepsis, SIRS, metabolic derangement.   [MB]  2542 I have ordered her ceftriaxone and Zithromax to cover bacterial pneumonia although this is likely viral in nature.  Will review with the  hospitalist regarding disposition.   [MB]  7062 Patient's room air pulse ox went down to 84% so she will require admission for hypoxia at least.  Waiting for the rest of her labs to review with hospitalist for admission.   [MB]  3762 Updated the patient regarding the need for admission and she is agreeable to plan.   [MB]  8315 Discussed with hospitalist Dr Pietro Cassis who will evaluate the patient for admission.   [MB]    Clinical Course User Index [MB] Hayden Rasmussen, MD   Kaitlyn Rodgers was evaluated in Emergency Department on 10/17/2018 for the symptoms described in the history of present illness. She was evaluated in the context of the global COVID-19 pandemic, which necessitated consideration that the patient might be at risk for infection with the SARS-CoV-2 virus that causes COVID-19. Institutional protocols and algorithms that pertain to the evaluation of patients at risk for COVID-19 are in a state of rapid change based on information released by regulatory bodies including the CDC and federal and state organizations. These policies and algorithms were followed during the patient's care in the ED.      Final Clinical Impressions(s) / ED Diagnoses   Final diagnoses:  [id number] virus detected  Multifocal pneumonia  Hypoxia  SIRS (systemic inflammatory response syndrome) ([place])  ED Discharge Orders    None       Hayden Rasmussen, MD 10/18/18 [phone]

## 2018-10-17 NOTE — ED Notes (Signed)
Bed: SL75 Expected date:  Expected time:  Means of arrival:  Comments: Low Sats

## 2018-10-17 NOTE — ED Triage Notes (Signed)
She c/o n/v and cough for several days. 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 She is mildly short of breath and in no distress.

## 2018-10-17 NOTE — ED Notes (Signed)
I just performed a room air trial. SPO2 on rm. Air: 84%. I immediately resumed her O2 at 6 l.p.m. with SPO2 of 96%.

## 2018-10-17 NOTE — ED Notes (Signed)
ED TO INPATIENT HANDOFF REPORT  Name/Age/Gender Kaitlyn Rodgers 43 y.o. female  Code Status    Code Status Orders  (From admission, onward)         Start     Ordered   10/17/18 1650  Full code  Continuous     10/17/18 1652        Code Status History    This patient has a current code status but no historical code status.      Home/SNF/Other Home  Chief Complaint SHoB, emesis  Level of Care/Admitting Diagnosis ED Disposition    ED Disposition Condition [facility] Area: [place] [[id number]]  Level of Care: Stepdown [14]  Admit to SDU based on following criteria: Hemodynamic compromise or significant risk of instability:  Patient requiring short term acute titration and management of vasoactive drips, and invasive monitoring (i.e., CVP and Arterial line).  Covid Evaluation: Confirmed COVID Positive  Isolation Risk Level: Low Risk  (Less than 4L [place] supplementation)  Diagnosis: Pneumonia due to COVID-19 virus [[id number]]  Admitting Physician: Terrilee Croak [[id number]]  Attending Physician: Terrilee Croak [[id number]]  Estimated length of stay: past midnight tomorrow  Certification:: I certify this patient will need inpatient services for at least 2 midnights  PT Class (Do Not Modify): Inpatient [101]  PT Acc Code (Do Not Modify): Private [1]       Medical History Past Medical History:  Diagnosis Date  . Abnormal vaginal bleeding   . Anemia   . Asthma    seasonal asthma-using inhaler daily at present  . Complication of anesthesia    panic attack after breast biopsy 20 years ago  . DM II (diabetes mellitus, type II), controlled ([place])    metformin 500mg  qd  . Fibroids   . GERD (gastroesophageal reflux disease)    occasional indigestion-tums prn  . Heart murmur   . Rheumatoid arthritis ([place])     Allergies No Known Allergies  IV Location/Drains/Wounds Patient Lines/Drains/Airways Status   Active Line/Drains/Airways    Name:    Placement date:   Placement time:   Site:   Days:   Peripheral IV 10/17/18 Left Antecubital   10/17/18    1315    Antecubital   less than 1          Labs/Imaging Results for orders placed or performed during the hospital encounter of 10/17/18 (from the past 48 hour(s))  Lactic acid, plasma     Status: None   Collection Time: 10/17/18  1:57 PM  Result Value Ref Range   Lactic Acid, Venous 1.3 0.5 - 1.9 mmol/L    Comment: Performed at [facility], [place] [address] [place], [place] [address]  CBC WITH DIFFERENTIAL     Status: Abnormal   Collection Time: 10/17/18  1:57 PM  Result Value Ref Range   WBC 9.5 4.0 - 10.5 K/uL   RBC 4.66 3.87 - 5.11 MIL/uL   Hemoglobin 12.7 12.0 - 15.0 g/dL   HCT 41.4 36.0 - 46.0 %   MCV 88.8 80.0 - 100.0 fL   MCH 27.3 26.0 - 34.0 pg   MCHC 30.7 30.0 - 36.0 g/dL   RDW 13.7 11.5 - 15.5 %   Platelets 403 (H) 150 - 400 K/uL   nRBC 0.0 0.0 - 0.2 %   Neutrophils Relative % 74 %   Neutro Abs 7.1 1.7 - 7.7 K/uL   Lymphocytes Relative 20 %   Lymphs Abs 1.9 0.7 - 4.0  K/uL   Monocytes Relative 5 %   Monocytes Absolute 0.5 0.1 - 1.0 K/uL   Eosinophils Relative 0 %   Eosinophils Absolute 0.0 0.0 - 0.5 K/uL   Basophils Relative 0 %   Basophils Absolute 0.0 0.0 - 0.1 K/uL   Immature Granulocytes 1 %   Abs Immature Granulocytes 0.07 0.00 - 0.07 K/uL   Reactive, Benign Lymphocytes PRESENT    Polychromasia PRESENT     Comment: Performed at [facility], [place] [address], [place], [place] [address]  Comprehensive metabolic panel     Status: Abnormal   Collection Time: 10/17/18  1:57 PM  Result Value Ref Range   Sodium 133 (L) 135 - 145 mmol/L   Potassium 3.3 (L) 3.5 - 5.1 mmol/L   Chloride 99 98 - 111 mmol/L   CO2 20 (L) 22 - 32 mmol/L   Glucose, Bld 218 (H) 70 - 99 mg/dL   BUN 9 6 - 20 mg/dL   Creatinine, Ser 0.62 0.44 - 1.00 mg/dL   Calcium 8.1 (L) 8.9 - 10.3 mg/dL   Total Protein 7.5 6.5 - 8.1 g/dL   Albumin 3.0 (L)  3.5 - 5.0 g/dL   AST 38 15 - 41 U/L   ALT 32 0 - 44 U/L   Alkaline Phosphatase 78 38 - 126 U/L   Total Bilirubin 0.7 0.3 - 1.2 mg/dL   GFR calc non Af Amer >60 >60 mL/min   GFR calc Af Amer >60 >60 mL/min   Anion gap 14 5 - 15    Comment: Performed at [facility], [place] [address], [place], [place] [address]  D-dimer, quantitative     Status: Abnormal   Collection Time: 10/17/18  1:57 PM  Result Value Ref Range   D-Dimer, Quant 0.65 (H) 0.00 - 0.50 ug/mL-FEU    Comment: (NOTE) At the manufacturer cut-off of 0.50 ug/mL FEU, this assay has been documented to exclude PE with a sensitivity and negative predictive value of 97 to 99%.  At this time, this assay has not been approved by the FDA to exclude DVT/VTE. Results should be correlated with clinical presentation. Performed at [facility], [place] [address] [place], [place] [address]   Procalcitonin     Status: None   Collection Time: 10/17/18  1:57 PM  Result Value Ref Range   Procalcitonin <0.10 ng/mL    Comment:        Interpretation: PCT (Procalcitonin) <= 0.5 ng/mL: Systemic infection (sepsis) is not likely. Local bacterial infection is possible. (NOTE)       Sepsis PCT Algorithm           Lower Respiratory Tract                                      Infection PCT Algorithm    ----------------------------     ----------------------------         PCT < 0.25 ng/mL                PCT < 0.10 ng/mL         Strongly encourage             Strongly discourage   discontinuation of antibiotics    initiation of antibiotics    ----------------------------     -----------------------------       PCT 0.25 - 0.50 ng/mL  PCT 0.10 - 0.25 ng/mL               OR       >80% decrease in PCT            Discourage initiation of                                            antibiotics      Encourage discontinuation           of antibiotics    ----------------------------      -----------------------------         PCT >= 0.50 ng/mL              PCT 0.26 - 0.50 ng/mL               AND        <80% decrease in PCT             Encourage initiation of                                             antibiotics       Encourage continuation           of antibiotics    ----------------------------     -----------------------------        PCT >= 0.50 ng/mL                  PCT > 0.50 ng/mL               AND         increase in PCT                  Strongly encourage                                      initiation of antibiotics    Strongly encourage escalation           of antibiotics                                     -----------------------------                                           PCT <= 0.25 ng/mL                                                 OR                                        > 80% decrease in PCT  Discontinue / Do not initiate                                             antibiotics Performed at [place] [address] [place], [place] [address]   Lactate dehydrogenase     Status: Abnormal   Collection Time: 10/17/18  1:57 PM  Result Value Ref Range   LDH 294 (H) 98 - 192 U/L    Comment: Performed at [facility], [place] [address] [place], [place] [address]  Ferritin     Status: None   Collection Time: 10/17/18  1:57 PM  Result Value Ref Range   Ferritin 193 11 - 307 ng/mL    Comment: Performed at [facility], [place] [address] [place], [place] [address]  Fibrinogen     Status: Abnormal   Collection Time: 10/17/18  1:57 PM  Result Value Ref Range   Fibrinogen 670 (H) 210 - 475 mg/dL    Comment: Performed at [facility], [place] [address], [place], [place] [address]  C-reactive protein     Status: Abnormal   Collection Time: 10/17/18  1:57 PM  Result Value Ref Range   CRP 14.9 (H) <1.0 mg/dL    Comment: Performed at [facility], [place] [address] [place], [place] [address]  Triglycerides     Status: Abnormal   Collection Time: 10/17/18  1:58 PM  Result Value Ref Range   Triglycerides 177 (H) <150 mg/dL    Comment: Performed at [facility], [place] [address] [place],  [address]   Dg Chest Port 1 View  Result Date: 10/17/2018 CLINICAL DATA:  Fever and short of breath, COVID-19 positive EXAM: PORTABLE CHEST 1 VIEW COMPARISON:  None. FINDINGS: Low lung volumes. Patchy peripheral right greater than left opacity. Patchy left basilar opacity. No pleural effusion. Heart size upper normal. No pneumothorax. IMPRESSION: Low lung volumes with patchy right greater than left largely peripheral infiltrates, consistent with atypical/viral pneumonia Electronically Signed   By: Donavan Foil M.D.   On: 10/17/2018 15:13    Pending Labs Unresulted Labs (From admission, onward)    Start     Ordered   10/18/18 0500  HIV antibody (Routine Testing)  Tomorrow morning,   R     10/17/18 1652   10/18/18 7124  Basic metabolic panel  Daily,   R     10/17/18 1652   10/18/18 0500  CBC  Daily,   R     10/17/18 1652   10/18/18 0500  C-reactive protein  Daily,   R     10/17/18 1657   10/18/18 0500  D-dimer, quantitative (not at [facility])  Daily,   R     10/17/18 1657   10/18/18 0500  Ferritin  Daily,   R     10/17/18 1657   10/18/18 0500  Procalcitonin  Daily,   R     10/17/18 1657   10/17/18 1649  Hemoglobin A1c  Add-on,   AD    Comments:  To assess prior glycemic control    10/17/18 1652          Vitals/Pain Today's Vitals   10/17/18 1600 10/17/18 1630 10/17/18 1700 10/17/18 1730  BP: 131/70 130/84 119/86 121/70  Pulse: 100 100 (!) 101 100  Resp: (!) 31 (!) 29 (!) 29 Marland Kitchen)  22  Temp:      TempSrc:      SpO2: 99% 94% 93% 94%  Weight:      Height:      PainSc:        Isolation Precautions Droplet and Contact precautions  Medications Medications  sodium chloride flush (NS) 0.9 %  injection 3 mL (has no administration in time range)  acetaminophen (TYLENOL) tablet 650 mg (has no administration in time range)    Or  acetaminophen (TYLENOL) suppository 650 mg (has no administration in time range)  senna (SENOKOT) tablet 8.6 mg (has no administration in time range)  magnesium hydroxide (MILK OF MAGNESIA) suspension 30 mL (has no administration in time range)  sorbitol 70 % solution 30 mL (has no administration in time range)  sodium phosphate (FLEET) 7-19 GM/118ML enema 1 enema (has no administration in time range)  ondansetron (ZOFRAN) tablet 4 mg (has no administration in time range)    Or  ondansetron (ZOFRAN) injection 4 mg (has no administration in time range)  insulin aspart (novoLOG) injection 0-9 Units (has no administration in time range)  insulin aspart (novoLOG) injection 0-5 Units (has no administration in time range)  enoxaparin (LOVENOX) injection 40 mg (has no administration in time range)  guaiFENesin-dextromethorphan (ROBITUSSIN DM) 100-10 MG/5ML syrup 10 mL (has no administration in time range)  chlorpheniramine-HYDROcodone (TUSSIONEX) 10-8 MG/5ML suspension 5 mL (has no administration in time range)  vitamin C (ASCORBIC ACID) tablet 500 mg (has no administration in time range)  zinc sulfate capsule 220 mg (has no administration in time range)  sodium chloride 0.9 % bolus 500 mL (0 mLs Intravenous Stopped 10/17/18 1550)  ondansetron (ZOFRAN) injection 4 mg (4 mg Intravenous Given 10/17/18 1426)  acetaminophen (TYLENOL) tablet 650 mg (650 mg Oral Given 10/17/18 1425)  cefTRIAXone (ROCEPHIN) 1 g in sodium chloride 0.9 % 100 mL IVPB (0 g Intravenous Stopped 10/17/18 1721)  azithromycin (ZITHROMAX) tablet 500 mg (500 mg Oral Given 10/17/18 1540)    Mobility walks

## 2018-10-18 ENCOUNTER — Encounter (HOSPITAL_COMMUNITY): Payer: Self-pay | Admitting: *Deleted

## 2018-10-18 LAB — CBC
HCT: 41.3 % (ref 36.0–46.0)
Hemoglobin: 12.5 g/dL (ref 12.0–15.0)
MCH: 27.3 pg (ref 26.0–34.0)
MCHC: 30.3 g/dL (ref 30.0–36.0)
MCV: 90.2 fL (ref 80.0–100.0)
Platelets: 397 10*3/uL (ref 150–400)
RBC: 4.58 MIL/uL (ref 3.87–5.11)
RDW: 13.8 % (ref 11.5–15.5)
WBC: 10.9 10*3/uL — ABNORMAL HIGH (ref 4.0–10.5)
nRBC: 0 % (ref 0.0–0.2)

## 2018-10-18 LAB — GLUCOSE, CAPILLARY
Glucose-Capillary: 179 mg/dL — ABNORMAL HIGH (ref 70–99)
Glucose-Capillary: 213 mg/dL — ABNORMAL HIGH (ref 70–99)
Glucose-Capillary: 202 mg/dL — ABNORMAL HIGH (ref 70–99)

## 2018-10-18 LAB — BASIC METABOLIC PANEL
Anion gap: 12 (ref 5–15)
BUN: 8 mg/dL (ref 6–20)
CO2: 22 mmol/L (ref 22–32)
Calcium: 8.4 mg/dL — ABNORMAL LOW (ref 8.9–10.3)
Chloride: 103 mmol/L (ref 98–111)
Creatinine, Ser: 0.63 mg/dL (ref 0.44–1.00)
GFR calc Af Amer: >60 mL/min (ref >60)
GFR calc non Af Amer: >60 mL/min (ref >60)
Glucose, Bld: 213 mg/dL — ABNORMAL HIGH (ref 70–99)
Potassium: 3.6 mmol/L (ref 3.5–5.1)
Sodium: 137 mmol/L (ref 135–145)

## 2018-10-18 LAB — HEMOGLOBIN A1C
Hgb A1c MFr Bld: 11.4 % — ABNORMAL HIGH (ref 4.8–5.6)
Mean Plasma Glucose: 280.48 mg/dL

## 2018-10-18 LAB — D-DIMER, QUANTITATIVE (NOT AT ARMC): D-Dimer, Quant: 0.77 ug/mL-FEU — ABNORMAL HIGH (ref 0.00–0.50)

## 2018-10-18 LAB — C-REACTIVE PROTEIN: CRP: 18.2 mg/dL — ABNORMAL HIGH (ref <1.0)

## 2018-10-18 LAB — FERRITIN: Ferritin: 211 ng/mL (ref 11–307)

## 2018-10-18 LAB — PROCALCITONIN: Procalcitonin: 0.16 ng/mL

## 2018-10-18 MED ORDER — AZITHROMYCIN 250 MG PO TABS
500.0000 mg | ORAL_TABLET | ORAL | Status: DC
  Administered 2018-10-18: 16:00:00 500 mg via ORAL
  Filled 2018-10-18: qty 2

## 2018-10-18 MED ORDER — ENOXAPARIN SODIUM 40 MG/0.4ML ~~LOC~~ SOLN
40.0000 mg | Freq: Two times a day (BID) | SUBCUTANEOUS | Status: DC
  Administered 2018-10-18 – 2018-10-20 (×5): 40 mg via SUBCUTANEOUS
  Filled 2018-10-18 (×5): qty 0.4

## 2018-10-18 MED ORDER — SODIUM CHLORIDE 0.9 % IV SOLN
1.0000 g | INTRAVENOUS | Status: DC
  Administered 2018-10-18: 17:00:00 1 g via INTRAVENOUS
  Filled 2018-10-18: qty 10
  Filled 2018-10-18: qty 1

## 2018-10-18 MED ORDER — INSULIN ASPART PROT & ASPART (70-30 MIX) 100 UNIT/ML ~~LOC~~ SUSP
10.0000 [IU] | Freq: Two times a day (BID) | SUBCUTANEOUS | Status: DC
  Administered 2018-10-18 – 2018-10-19 (×3): 10 [IU] via SUBCUTANEOUS
  Filled 2018-10-18: qty 10

## 2018-10-18 MED ORDER — INSULIN ASPART 100 UNIT/ML ~~LOC~~ SOLN
0.0000 [IU] | Freq: Three times a day (TID) | SUBCUTANEOUS | Status: DC
  Administered 2018-10-18: 3 [IU] via SUBCUTANEOUS
  Administered 2018-10-19 – 2018-10-20 (×5): 2 [IU] via SUBCUTANEOUS

## 2018-10-18 MED ORDER — PANTOPRAZOLE SODIUM 40 MG PO TBEC
40.0000 mg | DELAYED_RELEASE_TABLET | Freq: Every day | ORAL | Status: DC
  Administered 2018-10-18 – 2018-10-20 (×3): 40 mg via ORAL
  Filled 2018-10-18 (×3): qty 1

## 2018-10-18 NOTE — Care Management (Signed)
Case manager will monitor patient for appropriate discharge needs as she medically improves. May God Bless her to do so.

## 2018-10-18 NOTE — Progress Notes (Signed)
Report called to [facility] at [facility]. Stacey Drain

## 2018-10-18 NOTE — Progress Notes (Addendum)
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 Amil Amen office Rheum arthritis Achilles tendinosis lower extremities followed by Dr. 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  She understands underlying risks  Diabetes mellitus type 2-A1c 11.-started on insulin ~ 1 week PTA BMI 54  Change to 70/30 insulin 10 u bid  Change CBG to q8 [minimize nursing touches]  Stop-Orals [both can ? Cr CL]  Rh arthritis Need follow up OP when this is all over with Dr. Amil Amen No current role steroids--If used, would use minimal dose and shortest duration  OHSS Tested as OP--lost insurance--couldn't get machine NO BIPAP for now Restrictive lung disease Asthmatic [no PFT in the past--diagnosed in office? 2013 about]  ambulate Prone 16 hr a day  Reflux Stom pain at times, none now Doesn't take protonix--started today Cont carafate    Subjective/interval events    Weak and tired Sob sitting up Better laying down No cp, not tachy No sputum No diarr Overall about the dsame as prior Objective     BP 129/67 (BP Location: Left Arm)   Pulse 100   Temp (!) 102.1 F (38.9 C) (Oral)   Resp 18   Ht 5\' 4"  (1.626 m)   Wt (!) 145.2 kg   LMP 07/09/2011   SpO2 99%   BMI 54.93 kg/m      Intake/Output Summary (Last 24 hours) at 10/18/2018 1245 Last data filed at 10/17/2018 2141 Gross per 24 hour  Intake 3 ml  Output -  Net 3 ml    GPE Thick neck, Xerosis around neck [metabolixc syndrome x] mallampatti 4 gcs  15 No ict no pallor cta b-poor exam given habitus abd soft nt nd no rebound no guard--diffcult again exam LE soft supple no cord, no tenderness   DATA personally reviewed as of 10/18/18 and significant findings addressed below  BUN/creatinine normal, ferritin within normal no range Rise in CRP from 14.9--->18.2 Rise in procalcitonin from 0.10--->0.16 White count up from 9.5-10.9 A1c 11.4   Antibiotic/cultures    Best practice:    Glycemic control ordered: Yes SUP w/ right now no need for additional respiratory support however high risk patient for complication, comorbidity given #1 obesity, #2 diabetes mellitus DVT prophylaxis w: Lovenox prophylactic dose Disposition-transfer to [place] when bed available  Code status:    FULL at this time  time 60  DISCLAIMER This patient was evaluated at the [facility] in context of the [place] COVID-19 pandemic.  Institutional protocols and algorithms to patient care with COVID-19 are in a state of rapid change based on information from regulatory bodies including the CDC and federal and state organizations.  We adhered to rapidly changing policies and algorithms to the highest degree possible.

## 2018-10-19 DIAGNOSIS — J9621 Acute and chronic respiratory failure with hypoxia: Secondary | ICD-10-CM | POA: Diagnosis not present

## 2018-10-19 DIAGNOSIS — R6510 Systemic inflammatory response syndrome (SIRS) of non-infectious origin without acute organ dysfunction: Secondary | ICD-10-CM | POA: Diagnosis not present

## 2018-10-19 DIAGNOSIS — M069 Rheumatoid arthritis, unspecified: Secondary | ICD-10-CM

## 2018-10-19 DIAGNOSIS — E119 Type 2 diabetes mellitus without complications: Secondary | ICD-10-CM

## 2018-10-19 DIAGNOSIS — J189 Pneumonia, unspecified organism: Secondary | ICD-10-CM | POA: Diagnosis not present

## 2018-10-19 LAB — CBC WITH DIFFERENTIAL/PLATELET
Abs Immature Granulocytes: 0.12 10*3/uL — ABNORMAL HIGH (ref 0.00–0.07)
Basophils Absolute: 0 10*3/uL (ref 0.0–0.1)
Basophils Relative: 0 %
Eosinophils Absolute: 0 10*3/uL (ref 0.0–0.5)
Eosinophils Relative: 0 %
HCT: 40.2 % (ref 36.0–46.0)
Hemoglobin: 12.6 g/dL (ref 12.0–15.0)
Immature Granulocytes: 2 %
Lymphocytes Relative: 28 %
Lymphs Abs: 2.3 10*3/uL (ref 0.7–4.0)
MCH: 27.8 pg (ref 26.0–34.0)
MCHC: 31.3 g/dL (ref 30.0–36.0)
MCV: 88.5 fL (ref 80.0–100.0)
Monocytes Absolute: 0.5 10*3/uL (ref 0.1–1.0)
Monocytes Relative: 6 %
Neutro Abs: 5.2 10*3/uL (ref 1.7–7.7)
Neutrophils Relative %: 64 %
Platelets: 452 10*3/uL — ABNORMAL HIGH (ref 150–400)
RBC: 4.54 MIL/uL (ref 3.87–5.11)
RDW: 13.8 % (ref 11.5–15.5)
WBC: 8.2 10*3/uL (ref 4.0–10.5)
nRBC: 0 % (ref 0.0–0.2)

## 2018-10-19 LAB — CBC
HCT: 39.2 % (ref 36.0–46.0)
Hemoglobin: 12.2 g/dL (ref 12.0–15.0)
MCH: 27.5 pg (ref 26.0–34.0)
MCHC: 31.1 g/dL (ref 30.0–36.0)
MCV: 88.5 fL (ref 80.0–100.0)
Platelets: 436 10*3/uL — ABNORMAL HIGH (ref 150–400)
RBC: 4.43 MIL/uL (ref 3.87–5.11)
RDW: 13.8 % (ref 11.5–15.5)
WBC: 8.4 10*3/uL (ref 4.0–10.5)
nRBC: 0 % (ref 0.0–0.2)

## 2018-10-19 LAB — BASIC METABOLIC PANEL
Anion gap: 9 (ref 5–15)
BUN: 10 mg/dL (ref 6–20)
CO2: 27 mmol/L (ref 22–32)
Calcium: 8.7 mg/dL — ABNORMAL LOW (ref 8.9–10.3)
Chloride: 100 mmol/L (ref 98–111)
Creatinine, Ser: 0.6 mg/dL (ref 0.44–1.00)
GFR calc Af Amer: >60 mL/min (ref >60)
GFR calc non Af Amer: >60 mL/min (ref >60)
Glucose, Bld: 212 mg/dL — ABNORMAL HIGH (ref 70–99)
Potassium: 3.7 mmol/L (ref 3.5–5.1)
Sodium: 136 mmol/L (ref 135–145)

## 2018-10-19 LAB — GLUCOSE, CAPILLARY
Glucose-Capillary: 205 mg/dL — ABNORMAL HIGH (ref 70–99)
Glucose-Capillary: 158 mg/dL — ABNORMAL HIGH (ref 70–99)
Glucose-Capillary: 200 mg/dL — ABNORMAL HIGH (ref 70–99)
Glucose-Capillary: 185 mg/dL — ABNORMAL HIGH (ref 70–99)
Glucose-Capillary: 229 mg/dL — ABNORMAL HIGH (ref 70–99)

## 2018-10-19 LAB — PROCALCITONIN: Procalcitonin: <0.1 ng/mL

## 2018-10-19 LAB — HIV ANTIBODY (ROUTINE TESTING W REFLEX): HIV Screen 4th Generation wRfx: NONREACTIVE

## 2018-10-19 LAB — C-REACTIVE PROTEIN: CRP: 18 mg/dL — ABNORMAL HIGH (ref <1.0)

## 2018-10-19 LAB — D-DIMER, QUANTITATIVE: D-Dimer, Quant: 0.91 ug/mL-FEU — ABNORMAL HIGH (ref 0.00–0.50)

## 2018-10-19 LAB — FERRITIN: Ferritin: 187 ng/mL (ref 11–307)

## 2018-10-19 MED ORDER — INSULIN ASPART PROT & ASPART (70-30 MIX) 100 UNIT/ML ~~LOC~~ SUSP
20.0000 [IU] | Freq: Two times a day (BID) | SUBCUTANEOUS | Status: DC
  Administered 2018-10-19 – 2018-10-20 (×2): 20 [IU] via SUBCUTANEOUS

## 2018-10-19 NOTE — Progress Notes (Signed)
[facility] PROGRESS NOTE    Progress Note  Kaitlyn Rodgers  LZJ:[id number] DOB: 12/26/1975 DOA: 10/17/2018 PCP: Hayden Rasmussen, MD     Brief Narrative:   Kaitlyn Rodgers is an 43 y.o. female past medical history of diabetes mellitus type 2, rheumatoid arthritis who works on weekends at [employer] recently had a COVID eye break comes in for 1 week of shortness of breath cough and fever.  In the ED: Fever of 101 heart rate of 110 blood pressure stable. Satting 84% on room air which improved to 90% on 4 L.  Assessment/Plan:   Acute on chronic respiratory failure with hypoxia Pneumonia due to COVID-19 virus Symptoms started on 10/11/2018 COVID-19: D-dimers: 0.9 CRP of 18 Procalcitonin is less than 0.1 will discontinue all empiric antibiotics. He is to require nasal cannula, she relates really short of breath when she ambulates.  Type 2 diabetes mellitus without complication (HCC) Continue to hold all oral hypoglycemic agents. Increase 70/30 insulin 20 units twice a day. Blood glucose is above 200.  Rheumatoid arthritis ([place]) Currently only on Neurontin at home.    DVT prophylaxis: lovenox Family Communication:none Disposition Plan/Barrier to D/C: unable to determine Code Status:     Code Status Orders  (From admission, onward)         Start     Ordered   10/17/18 1650  Full code  Continuous     10/17/18 1652        Code Status History    This patient has a current code status but no historical code status.        IV Access:    Peripheral IV   Procedures and diagnostic studies:   Dg Chest Port 1 View  Result Date: 10/17/2018 CLINICAL DATA:  Fever and short of breath, COVID-19 positive EXAM: PORTABLE CHEST 1 VIEW COMPARISON:  None. FINDINGS: Low lung volumes. Patchy peripheral right greater than left opacity. Patchy left basilar opacity. No pleural effusion. Heart size upper normal. No pneumothorax. IMPRESSION: Low lung volumes with patchy  right greater than left largely peripheral infiltrates, consistent with atypical/viral pneumonia Electronically Signed   By: Donavan Foil M.D.   On: 10/17/2018 15:13     Medical Consultants:    None.  Anti-Infectives:   None  Subjective:    Kaitlyn Rodgers she relates her breathing is unchanged she continues to have dyspnea with ambulation.  Objective:    Vitals:   10/18/18 2100 10/18/18 2132 10/19/18 0500 10/19/18 0800  BP: 120/80   119/85  Pulse: 97 98  88  Resp: (!) 36 20  (!) 32  Temp: 100 F (37.8 C)  98.6 F (37 C) 98.9 F (37.2 C)  TempSrc:   Oral Oral  SpO2: 94% 92%  98%  Weight:      Height:       No intake or output data in the 24 hours ending 10/19/18 0826 Filed Weights   10/17/18 1336  Weight: (!) 145.2 kg    Exam: General exam: In no acute distress Respiratory system: Good air movement and clear to auscultation. Cardiovascular system: Rate and rhythm with positive S1-S2 Gastrointestinal system: Bowel sounds soft nontender nondistended Central nervous system: Wake alert and oriented x3 nonfocal Extremities: Lower extremity edema Skin: No rashes or ulceration. Psychiatry: Judgement and insight appear normal. Mood & affect appropriate.     Data Reviewed:    Labs: Basic Metabolic Panel: Recent Labs  Lab 10/17/18 1357 10/18/18 0421 10/19/18 0516  NA 133* 137 136  K 3.3* 3.6 3.7  CL 99 103 100  CO2 20* 22 27  GLUCOSE 218* 213* 212*  BUN 9 8 10   CREATININE 0.62 0.63 0.60  CALCIUM 8.1* 8.4* 8.7*   GFR Estimated Creatinine Clearance: 131.5 mL/min (by C-G formula based on SCr of 0.6 mg/dL). Liver Function Tests: Recent Labs  Lab 10/17/18 1357  AST 38  ALT 32  ALKPHOS 78  BILITOT 0.7  PROT 7.5  ALBUMIN 3.0*   No results for input(s): LIPASE, AMYLASE in the last 168 hours. No results for input(s): AMMONIA in the last 168 hours. Coagulation profile No results for input(s): INR, PROTIME in the last 168 hours.  CBC: Recent Labs   Lab 10/17/18 1357 10/18/18 0421 10/19/18 0516  WBC 9.5 10.9* 8.4  NEUTROABS 7.1  --   --   HGB 12.7 12.5 12.2  HCT 41.4 41.3 39.2  MCV 88.8 90.2 88.5  PLT 403* 397 436*   Cardiac Enzymes: No results for input(s): CKTOTAL, CKMB, CKMBINDEX, TROPONINI in the last 168 hours. BNP (last 3 results) No results for input(s): PROBNP in the last 8760 hours. CBG: Recent Labs  Lab 10/17/18 2050 10/18/18 0844 10/18/18 1236 10/18/18 1634  GLUCAP 197* 179* 213* 202*   D-Dimer: Recent Labs    10/18/18 0421 10/19/18 0516  DDIMER 0.77* 0.91*   Hgb A1c: Recent Labs    10/17/18 1357  HGBA1C 11.4*   Lipid Profile: Recent Labs    10/17/18 1358  TRIG 177*   Thyroid function studies: No results for input(s): TSH, T4TOTAL, T3FREE, THYROIDAB in the last 72 hours.  Invalid input(s): FREET3 Anemia work up: Recent Labs    10/18/18 0421 10/19/18 0516  FERRITIN 211 187   Sepsis Labs: Recent Labs  Lab 10/17/18 1357 10/18/18 0421 10/19/18 0516  PROCALCITON <0.10 0.16 <0.10  WBC 9.5 10.9* 8.4  LATICACIDVEN 1.3  --   --    Microbiology No results found for this or any previous visit (from the past 240 hour(s)).   Medications:    azithromycin  500 mg Oral Q24H   enoxaparin (LOVENOX) injection  40 mg Subcutaneous Q12H   fluticasone furoate-vilanterol  1 puff Inhalation Daily   gabapentin  300 mg Oral TID   insulin aspart  0-9 Units Subcutaneous TID WC   insulin aspart protamine- aspart  10 Units Subcutaneous BID WC   mouth rinse  15 mL Mouth Rinse BID   montelukast  10 mg Oral Daily   pantoprazole  40 mg Oral Daily   senna  1 tablet Oral BID   sodium chloride flush  3 mL Intravenous Q12H   sucralfate  1 g Oral BID   vitamin C  500 mg Oral Daily   zinc sulfate  220 mg Oral Daily   Continuous Infusions:  cefTRIAXone (ROCEPHIN)  IV 1 g (10/18/18 1644)      LOS: 2 days   [name]  [facility]  10/19/2018, 8:26 AM

## 2018-10-20 DIAGNOSIS — J189 Pneumonia, unspecified organism: Secondary | ICD-10-CM | POA: Diagnosis not present

## 2018-10-20 DIAGNOSIS — R6510 Systemic inflammatory response syndrome (SIRS) of non-infectious origin without acute organ dysfunction: Secondary | ICD-10-CM | POA: Diagnosis not present

## 2018-10-20 DIAGNOSIS — Z794 Long term (current) use of insulin: Secondary | ICD-10-CM

## 2018-10-20 DIAGNOSIS — J9621 Acute and chronic respiratory failure with hypoxia: Secondary | ICD-10-CM | POA: Diagnosis not present

## 2018-10-20 LAB — COMPREHENSIVE METABOLIC PANEL
ALT: 20 U/L (ref 0–44)
AST: 34 U/L (ref 15–41)
Albumin: 2.7 g/dL — ABNORMAL LOW (ref 3.5–5.0)
Alkaline Phosphatase: 65 U/L (ref 38–126)
Anion gap: 9 (ref 5–15)
BUN: 11 mg/dL (ref 6–20)
CO2: 27 mmol/L (ref 22–32)
Calcium: 8.6 mg/dL — ABNORMAL LOW (ref 8.9–10.3)
Chloride: 101 mmol/L (ref 98–111)
Creatinine, Ser: 0.55 mg/dL (ref 0.44–1.00)
GFR calc Af Amer: >60 mL/min (ref >60)
GFR calc non Af Amer: >60 mL/min (ref >60)
Glucose, Bld: 183 mg/dL — ABNORMAL HIGH (ref 70–99)
Potassium: 3.5 mmol/L (ref 3.5–5.1)
Sodium: 137 mmol/L (ref 135–145)
Total Bilirubin: 0.5 mg/dL (ref 0.3–1.2)
Total Protein: 7 g/dL (ref 6.5–8.1)

## 2018-10-20 LAB — GLUCOSE, CAPILLARY
Glucose-Capillary: 174 mg/dL — ABNORMAL HIGH (ref 70–99)
Glucose-Capillary: 192 mg/dL — ABNORMAL HIGH (ref 70–99)

## 2018-10-20 LAB — D-DIMER, QUANTITATIVE: D-Dimer, Quant: 0.89 ug/mL-FEU — ABNORMAL HIGH (ref 0.00–0.50)

## 2018-10-20 LAB — FERRITIN: Ferritin: 176 ng/mL (ref 11–307)

## 2018-10-20 LAB — C-REACTIVE PROTEIN: CRP: 9.6 mg/dL — ABNORMAL HIGH (ref <1.0)

## 2018-10-20 MED ORDER — BASAGLAR KWIKPEN 100 UNIT/ML ~~LOC~~ SOPN: 20.0000 [IU] | PEN_INJECTOR | Freq: Two times a day (BID) | SUBCUTANEOUS | 11 refills | Status: AC

## 2018-10-20 NOTE — Progress Notes (Signed)
SATURATION QUALIFICATIONS: (This note is used to comply with regulatory documentation for home oxygen)  Patient Saturations on Room Air at Rest = 99%  Patient Saturations on Room Air while Ambulating = 86%  Patient Saturations on 1.5 Liters of oxygen while Ambulating = 93%  Please briefly explain why patient needs home oxygen: Patient's O2 saturations dropped to 86% while ambulating on room air. Patient's O2 saturations went up to 93% while ambulating on 1.5L of oxygen.

## 2018-10-20 NOTE — Discharge Instructions (Signed)
Kaitlyn Rodgers was admitted to the Hospital on 10/17/2018 and Discharged on Discharge Date 10/20/2018 and should be excused from work/school   for 14  days starting 10/17/2018 , may return to work/school without any restrictions.  Call [name] MD, [place] [phone] with questions.  [name] M.D on 10/20/2018,at 9:41 AM  [facility] Office  [phone]   The decision to discontinue isolation* should be made in the context of local circumstances. Options now include both 1) a time-since-illness-onset and time-since-recovery (non-test-based) strategy, and 2) test-based strategy. Time-since-illness-onset and time-since-recovery strategy (non-test-based strategy)** Persons with COVID-19 who have symptoms and were directed to care for themselves at home may discontinue isolation under the following conditions: At least 3 days (72 hours) have passed since recovery defined as resolution of fever without the use of fever-reducing medications and Improvement in respiratory symptoms (e.g., cough, shortness of breath); and, At least 7 days have passed since symptoms first appeared. Test-based strategy (simplified from initial protocol) Previous recommendations for a test-based strategy remain applicable; however, a test-based strategy is contingent on the availability of ample testing supplies and laboratory capacity as well as convenient access to testing. For jurisdictions that choose to use a test-based strategy, the recommended protocol has been simplified so that only one swab is needed at every sampling. Persons who have COVID-19 who have symptoms and were directed to care for themselves at home may discontinue isolation under the following conditions: Resolution of fever without the use of fever-reducing medications and Improvement in respiratory symptoms (e.g., cough, shortness of breath) and Negative results of an FDA Emergency Use Authorized molecular assay  for COVID-19 from at least two consecutive nasopharyngeal swab specimens collected ?24 hours apart (total of two negative specimens). See Interim Guidelines for Collecting, Handling, and Testing Clinical Specimens from Persons Under Investigation (PUIs) for 2019 Novel Coronavirus (2019-nCoV)for specimen collection guidance. Persons with laboratory-confirmed COVID-19 who have not had any symptoms may discontinue isolation when at least 7 days have passed since the date of their first positive COVID-19 diagnostic test and have had no subsequent illness provided they remain asymptomatic. For 3 days following discontinuation of isolation, these persons should continue to limit contact (stay 6 feet away from others) and limit potential of dispersal of respiratory secretions by wearing a covering for their nose and mouth whenever they are in settings where other persons are present. In community settings, this covering may be a barrier mask, such as a bandana, scarf, or cloth mask. The covering does not refer to a medical mask or respirator. Footnotes

## 2018-10-20 NOTE — Discharge Summary (Signed)
Physician Discharge Summary  Kaitlyn Rodgers [id number] DOB: 04-Aug-1975 DOA: 10/17/2018  PCP: Hayden Rasmussen, MD  Admit date: 10/17/2018 Discharge date: 10/20/2018  Admitted From: Home Disposition:  Home  Recommendations for Outpatient Follow-up:  1. Follow up with PCP in 1-2 weeks 2. Please obtain BMP/CBC in one week 3. She will also need titration of her hypoglycemic agent specifically her insulin as her A1c was 8.4, she will follow-up with PCP in 2 weeks and further titration of her insulin will have to be done as an outpatient  Home Health:No Equipment/Devices:None  Discharge Condition:Stable CODE STATUS:Full Diet recommendation: Heart Healthy   Brief/Interim Summary: 43 y.o. female past medical history of diabetes mellitus type 2, rheumatoid arthritis who works on weekends at [employer] rehab recently had a COVID eye break comes in for 1 week of shortness of breath cough and fever.  Discharge Diagnoses:  Active Problems:   Pneumonia due to COVID-19 virus   Acute on chronic respiratory failure with hypoxia (HCC)   Type 2 diabetes mellitus without complication (HCC)   Rheumatoid arthritis (HCC)   SIRS (systemic inflammatory response syndrome) (HCC)  Acute respiratory failure with hypoxia due to pneumonia due to COVID 19 virus: She relate her symptoms started on 10/11/2018, she was managed with conservative management oxygen and fluid restriction. Her COVID markers d-dimer and CRP continue to improve over hospital stay, she relates she was breathing significantly better the chest and her pain was improved and she was able to ambulate without getting short of breath while on oxygen to the bedside commode. She was initially placed on 6 to 8 L at the time of discharge to 1.6. She will go home on 2 L of oxygen and will be reevaluated then.  Sirs due to COVID-19 virus infection: Initially she was tachycardic which resolved with conservative management her blood pressure remained  stable. Go home on 2 L of oxygen.  Diabetes mellitus type 2 without complications: Her oral hypoglycemic agents were held on admission she was placed on 70/30 twice a day.  But her blood glucose remained above 200.  On the day of discharge she was sent home on long-acting insulin at a higher dose and her oral hypoglycemic agents will be resumed as an outpatient. She is to check her blood glucose before meals and at bedtime, she will follow-up with her PCP in 2 weeks which will titrate her insulin regimen as needed. Her hemoglobin A1c was 11.4.  Rheumatoid arthritis: No changes made to her medication continue Neurontin.  Discharge Instructions  Discharge Instructions    Diet - low sodium heart healthy   Complete by:  As directed    Increase activity slowly   Complete by:  As directed    MyChart COVID-19 home monitoring program   Complete by:  Oct 20, 2018    Is the patient willing to use the [place] for home monitoring?:  Yes   Temperature monitoring   Complete by:  Oct 20, 2018    After how many days would you like to receive a notification of this patient's flowsheet entries?:  1     Allergies as of 10/20/2018   No Known Allergies     Medication List    TAKE these medications   Basaglar KwikPen 100 UNIT/ML Sopn Inject 0.2 mLs (20 Units total) into the skin 2 (two) times daily. What changed:    how much to take  when to take this   budesonide-formoterol 160-4.5 MCG/ACT inhaler Commonly known as:  SYMBICORT Inhale 2 puffs into the lungs 2 (two) times daily.   chlorhexidine 0.12 % solution Commonly known as:  PERIDEX Use as directed 15 mLs in the mouth or throat 2 (two) times daily.   fluticasone 50 MCG/ACT nasal spray Commonly known as:  FLONASE Place 1 spray into both nostrils daily.   gabapentin 300 MG capsule Commonly known as:  NEURONTIN Take 300 mg by mouth 3 (three) times daily.   Jardiance 25 MG Tabs tablet Generic drug:  empagliflozin Take  25 mg by mouth daily.   metFORMIN 500 MG tablet Commonly known as:  GLUCOPHAGE Take 500 mg by mouth daily.   montelukast 10 MG tablet Commonly known as:  SINGULAIR Take 10 mg by mouth daily.   sucralfate 1 g tablet Commonly known as:  CARAFATE Take 1 g by mouth 2 (two) times daily.       No Known Allergies  Consultations: None  Procedures/Studies: Dg Chest Port 1 View  Result Date: 10/17/2018 CLINICAL DATA:  Fever and short of breath, COVID-19 positive EXAM: PORTABLE CHEST 1 VIEW COMPARISON:  None. FINDINGS: Low lung volumes. Patchy peripheral right greater than left opacity. Patchy left basilar opacity. No pleural effusion. Heart size upper normal. No pneumothorax. IMPRESSION: Low lung volumes with patchy right greater than left largely peripheral infiltrates, consistent with atypical/viral pneumonia Electronically Signed   By: Donavan Foil M.D.   On: 10/17/2018 15:13     Subjective: Lates her breathing is significantly better compared to yesterday, she could ambulate with oxygen to the bedside commode and does not feel as dyspneic as she was 2 days ago.  Discharge Exam: Vitals:   10/20/18 0823 10/20/18 0900  BP:    Pulse:  (!) 102  Resp:    Temp: 98.7 F (37.1 C)   SpO2:  96%     General: No acute distress Cardiovascular: Rate and rhythm with positive S1-S2 Respiratory: Lungs are clear to auscultation. Abdominal: Soft, NT, ND, bowel sounds + Extremities: no edema, no cyanosis    The results of significant diagnostics from this hospitalization (including imaging, microbiology, ancillary and laboratory) are listed below for reference.     Microbiology: No results found for this or any previous visit (from the past 240 hour(s)).   Labs: BNP (last 3 results) No results for input(s): BNP in the last 8760 hours. Basic Metabolic Panel: Recent Labs  Lab 10/17/18 1357 10/18/18 0421 10/19/18 0516 10/20/18 0415  NA 133* 137 136 137  K 3.3* 3.6 3.7 3.5  CL  99 103 100 101  CO2 20* 22 27 27   GLUCOSE 218* 213* 212* 183*  BUN 9 8 10 11   CREATININE 0.62 0.63 0.60 0.55  CALCIUM 8.1* 8.4* 8.7* 8.6*   Liver Function Tests: Recent Labs  Lab 10/17/18 1357 10/20/18 0415  AST 38 34  ALT 32 20  ALKPHOS 78 65  BILITOT 0.7 0.5  PROT 7.5 7.0  ALBUMIN 3.0* 2.7*   No results for input(s): LIPASE, AMYLASE in the last 168 hours. No results for input(s): AMMONIA in the last 168 hours. CBC: Recent Labs  Lab 10/17/18 1357 10/18/18 0421 10/19/18 0516 10/19/18 1530  WBC 9.5 10.9* 8.4 8.2  NEUTROABS 7.1  --   --  5.2  HGB 12.7 12.5 12.2 12.6  HCT 41.4 41.3 39.2 40.2  MCV 88.8 90.2 88.5 88.5  PLT 403* 397 436* 452*   Cardiac Enzymes: No results for input(s): CKTOTAL, CKMB, CKMBINDEX, TROPONINI in the last 168 hours. BNP: Invalid input(s):  POCBNP CBG: Recent Labs  Lab 10/19/18 0807 10/19/18 1123 10/19/18 1709 10/19/18 2049 10/20/18 0751  GLUCAP 158* 200* 185* 229* 174*   D-Dimer Recent Labs    10/19/18 0516 10/20/18 0415  DDIMER 0.91* 0.89*   Hgb A1c Recent Labs    10/17/18 1357  HGBA1C 11.4*   Lipid Profile Recent Labs    10/17/18 1358  TRIG 177*   Thyroid function studies No results for input(s): TSH, T4TOTAL, T3FREE, THYROIDAB in the last 72 hours.  Invalid input(s): FREET3 Anemia work up Recent Labs    10/19/18 0516 10/20/18 0415  FERRITIN 187 176   Urinalysis    Component Value Date/Time   COLORURINE RED (A) 01/24/2011 1926   APPEARANCEUR CLOUDY (A) 01/24/2011 1926   LABSPEC >1.030 (H) 01/24/2011 1926   PHURINE 5.5 01/24/2011 1926   GLUCOSEU NEGATIVE 01/24/2011 1926   HGBUR LARGE (A) 01/24/2011 1926   BILIRUBINUR SMALL (A) 01/24/2011 1926   KETONESUR NEGATIVE 01/24/2011 1926   PROTEINUR 100 (A) 01/24/2011 1926   UROBILINOGEN 1.0 01/24/2011 1926   NITRITE POSITIVE (A) 01/24/2011 1926   LEUKOCYTESUR TRACE (A) 01/24/2011 1926   Sepsis Labs Invalid input(s): PROCALCITONIN,  WBC,   LACTICIDVEN Microbiology No results found for this or any previous visit (from the past 240 hour(s)).   Time coordinating discharge: 40 minutes  SIGNED:   [name], MD  [facility]

## 2018-10-20 NOTE — Progress Notes (Deleted)
[facility] PROGRESS NOTE    Progress Note  Kaitlyn Rodgers  YTK:[id number] DOB: 1976-01-02 DOA: 10/17/2018 PCP: Hayden Rasmussen, MD     Brief Narrative:   Kaitlyn Rodgers is an 43 y.o. female past medical history of diabetes mellitus type 2, rheumatoid arthritis who works on weekends at [employer] recently had a COVID eye break comes in for 1 week of shortness of breath cough and fever.  In the ED: Fever of 101 heart rate of 110 blood pressure stable. Satting 84% on room air which improved to 90% on 4 L. D/c today Assessment/Plan:   Acute on chronic respiratory failure with hypoxia Pneumonia due to COVID-19 virus Symptoms started on 10/11/2018 COVID-19: D-dimers: 0.9> 0.9 CRP of 18>9.6 Ferritin has plateaued at 211 >176, now improving. Saturations have improved they have been greater than 92% on 2 L of oxygen, her oxygen requirement is decreasing about 2 to 3 days ago it was 6 L.  Continue to monitor closely. Her CRP is significantly improved  Sirs due to COVID-19 virus infection: Blood pressure has been stable she has remained afebrile she is not tachycardic.  Type 2 diabetes mellitus without complication (HCC) Continue to hold all oral hypoglycemic agents. Increase 70/30 insulin 20 units twice a day. Blood glucose is above 200.  Rheumatoid arthritis ([place]) Currently only on Neurontin at home.    DVT prophylaxis: lovenox Family Communication:none Disposition Plan/Barrier to D/C: unable to determine Code Status:     Code Status Orders  (From admission, onward)         Start     Ordered   10/17/18 1650  Full code  Continuous     10/17/18 1652        Code Status History    This patient has a current code status but no historical code status.        IV Access:    Peripheral IV   Procedures and diagnostic studies:   No results found.   Medical Consultants:    None.  Anti-Infectives:   None  Subjective:    Kaitlyn Rodgers she  relates her breathing is unchanged she continues to have dyspnea with ambulation.  Objective:    Vitals:   10/19/18 1905 10/19/18 2045 10/20/18 0048 10/20/18 0512  BP:  (!) 92/54 106/61 106/63  Pulse:  96 97 93  Resp:  (!) 35 (!) 35 11  Temp: 98.8 F (37.1 C) 98.7 F (37.1 C) 99.7 F (37.6 C) 99.2 F (37.3 C)  TempSrc: Oral Oral Oral Oral  SpO2:  92% 94% 96%  Weight:      Height:        Intake/Output Summary (Last 24 hours) at 10/20/2018 0756 Last data filed at 10/19/2018 1756 Gross per 24 hour  Intake 243 ml  Output 450 ml  Net -207 ml   Filed Weights   10/17/18 1336  Weight: (!) 145.2 kg    Exam: General exam: In no acute distress Respiratory system: Good air movement and clear to auscultation. Cardiovascular system: Rate and rhythm with positive S1-S2 Gastrointestinal system: Bowel sounds soft nontender nondistended Central nervous system: Wake alert and oriented x3 nonfocal Extremities: Lower extremity edema Skin: No rashes or ulceration. Psychiatry: Judgement and insight appear normal. Mood & affect appropriate.     Data Reviewed:    Labs: Basic Metabolic Panel: Recent Labs  Lab 10/17/18 1357 10/18/18 0421 10/19/18 0516 10/20/18 0415  NA 133* 137 136 137  K 3.3* 3.6 3.7 3.5  CL 99  103 100 101  CO2 20* 22 27 27   GLUCOSE 218* 213* 212* 183*  BUN 9 8 10 11   CREATININE 0.62 0.63 0.60 0.55  CALCIUM 8.1* 8.4* 8.7* 8.6*   GFR Estimated Creatinine Clearance: 131.5 mL/min (by C-G formula based on SCr of 0.55 mg/dL). Liver Function Tests: Recent Labs  Lab 10/17/18 1357 10/20/18 0415  AST 38 34  ALT 32 20  ALKPHOS 78 65  BILITOT 0.7 0.5  PROT 7.5 7.0  ALBUMIN 3.0* 2.7*   No results for input(s): LIPASE, AMYLASE in the last 168 hours. No results for input(s): AMMONIA in the last 168 hours. Coagulation profile No results for input(s): INR, PROTIME in the last 168 hours.  CBC: Recent Labs  Lab 10/17/18 1357 10/18/18 0421 10/19/18 0516  10/19/18 1530  WBC 9.5 10.9* 8.4 8.2  NEUTROABS 7.1  --   --  5.2  HGB 12.7 12.5 12.2 12.6  HCT 41.4 41.3 39.2 40.2  MCV 88.8 90.2 88.5 88.5  PLT 403* 397 436* 452*   Cardiac Enzymes: No results for input(s): CKTOTAL, CKMB, CKMBINDEX, TROPONINI in the last 168 hours. BNP (last 3 results) No results for input(s): PROBNP in the last 8760 hours. CBG: Recent Labs  Lab 10/18/18 2127 10/19/18 0807 10/19/18 1123 10/19/18 1709 10/19/18 2049  GLUCAP 205* 158* 200* 185* 229*   D-Dimer: Recent Labs    10/19/18 0516 10/20/18 0415  DDIMER 0.91* 0.89*   Hgb A1c: Recent Labs    10/17/18 1357  HGBA1C 11.4*   Lipid Profile: Recent Labs    10/17/18 1358  TRIG 177*   Thyroid function studies: No results for input(s): TSH, T4TOTAL, T3FREE, THYROIDAB in the last 72 hours.  Invalid input(s): FREET3 Anemia work up: Recent Labs    10/19/18 0516 10/20/18 0415  FERRITIN 187 176   Sepsis Labs: Recent Labs  Lab 10/17/18 1357 10/18/18 0421 10/19/18 0516 10/19/18 1530  PROCALCITON <0.10 0.16 <0.10  --   WBC 9.5 10.9* 8.4 8.2  LATICACIDVEN 1.3  --   --   --    Microbiology No results found for this or any previous visit (from the past 240 hour(s)).   Medications:   . enoxaparin (LOVENOX) injection  40 mg Subcutaneous Q12H  . fluticasone furoate-vilanterol  1 puff Inhalation Daily  . gabapentin  300 mg Oral TID  . insulin aspart  0-9 Units Subcutaneous TID WC  . insulin aspart protamine- aspart  20 Units Subcutaneous BID WC  . mouth rinse  15 mL Mouth Rinse BID  . montelukast  10 mg Oral Daily  . pantoprazole  40 mg Oral Daily  . senna  1 tablet Oral BID  . sodium chloride flush  3 mL Intravenous Q12H  . sucralfate  1 g Oral BID  . vitamin C  500 mg Oral Daily  . zinc sulfate  220 mg Oral Daily   Continuous Infusions:     LOS: 3 days   [name]  [facility]  10/20/2018, 7:56 AM

## 2018-10-20 NOTE — TOC Progression Note (Signed)
Transition of Care [facility]) - Progression Note    Patient Details  Name: [name] MRN: [id number] Date of Birth: 1976-02-15  Transition [facility] CM/SW Contact  Loletha Grayer Beverely Pace, RN Phone Number: 10/20/2018, 12:13 PM  Clinical Narrative:  43 yr old female admitted for treatment for COVID 19.  Patient will need oxygen at discharge. CM spoke with patient concerning discharge plan. Patient lives home with her husband. CM called request for oxygen for home to [facility], [employer]. CM spoke with Geri [place], who will fax order, sats and demographics to [facility] Fax: [phone]. Patient and bedside RN aware that patient will receive a tank for transport home and will be discharged once confirmation received that concentrator and tanks  Have  been delivered to patient's home.    Expected Discharge Plan: Home/Self Care Barriers to Discharge: No Barriers Identified  Expected Discharge Plan and Services Expected Discharge Plan: Home/Self Care   Discharge Planning Services: CM Consult   Living arrangements for the past 2 months: Single Family Home Expected Discharge Date: 10/20/18               DME Arranged: Oxygen DME Agency: [facility] Date DME Agency Contacted: 10/20/18 Time DME Agency Contacted: [id number] Representative spoke with at DME Agency: [facility] [phone]) HH Arranged: NA HH Agency: NA         Social Determinants of Health ([place]) Interventions    Readmission Risk Interventions No flowsheet data found.

## 2018-10-20 NOTE — Plan of Care (Signed)
  Problem: Clinical Measurements: Goal: Ability to maintain clinical measurements within normal limits will improve Outcome: Completed/Met Goal: Will remain free from infection Outcome: Completed/Met Goal: Diagnostic test results will improve Outcome: Completed/Met Goal: Respiratory complications will improve Outcome: Completed/Met Goal: Cardiovascular complication will be avoided Outcome: Completed/Met   Problem: Nutrition: Goal: Adequate nutrition will be maintained Outcome: Completed/Met   Problem: Coping: Goal: Level of anxiety will decrease Outcome: Completed/Met   Problem: Elimination: Goal: Will not experience complications related to bowel motility Outcome: Completed/Met Goal: Will not experience complications related to urinary retention Outcome: Completed/Met   Problem: Pain Managment: Goal: General experience of comfort will improve Outcome: Completed/Met   Problem: Safety: Goal: Ability to remain free from injury will improve Outcome: Completed/Met   Problem: Skin Integrity: Goal: Risk for impaired skin integrity will decrease Outcome: Completed/Met   Problem: Education: Goal: Knowledge of risk factors and measures for prevention of condition will improve Outcome: Completed/Met   Problem: Coping: Goal: Psychosocial and spiritual needs will be supported Outcome: Completed/Met   Problem: Respiratory: Goal: Will maintain a patent airway Outcome: Completed/Met Goal: Complications related to the disease process, condition or treatment will be avoided or minimized Outcome: Completed/Met

## 2018-10-21 ENCOUNTER — Telehealth (INDEPENDENT_AMBULATORY_CARE_PROVIDER_SITE_OTHER): Payer: Self-pay | Admitting: Family Medicine

## 2018-10-21 NOTE — Telephone Encounter (Signed)
Called and spoke with pt regarding [place]. Texted link and advised pt to sign up, log in, and complete daily survey. Pt will try to do so.

## 2018-10-26 NOTE — Telephone Encounter (Signed)
Called to speak with pt regarding setup and COVID19 monitoring. Pts husband advised she is sleeping.

## 2018-10-28 NOTE — Telephone Encounter (Signed)
No answer - retexted mychart link

## 2019-05-02 DIAGNOSIS — Z8639 Personal history of other endocrine, nutritional and metabolic disease: Secondary | ICD-10-CM | POA: Insufficient documentation

## 2019-05-02 NOTE — Progress Notes (Signed)
Office Visit Note  Patient: Kaitlyn Rodgers             Date of Birth: 12-Dec-1975           MRN: [id number]             PCP: Hayden Rasmussen, MD Referring: Hayden Rasmussen, MD Visit Date: 05/11/2019 Occupation: LPN  Subjective:  Pain in multiple joints.   History of Present Illness: Kaitlyn Rodgers is a 43 y.o. female seen in consultation per request of her PCP with history of possible rheumatoid arthritis and joint pain.  According to patient while she was living in [place] 11 years ago she started having pain in her bilateral ankle joints.  Gradually the pain moved to all of her joints.  She states she went to her PCP and at that time the labs showed positive rheumatoid factor.  She was referred to a rheumatologist who did x-rays and labs but she did not follow-up as she moved to [place].  She states she has been living in [place] for the last 11 years and dealt with the pain with Tylenol and  ibuprofen only.  She states she saw Dr. Darron Doom about 2 years ago had at that time she referred her to a rheumatologist but she did not keep that appointment.  She was referred to me now.  She states she has pain in her both shoulders more on the right side of her body, both hip joints, knee joints, ankles and her feet.  The top of her right foot is painful.  She also has discomfort in her lower back.  She denies discomfort in her elbows or hands.  She has noticed swelling in her ankle joints.  She had a first cousin with lupus who recently passed away.  Activities of Daily Living:  Patient reports morning stiffness for 5-10 minutes.   Patient Reports nocturnal pain.  Difficulty dressing/grooming: Denies Difficulty climbing stairs: Reports Difficulty getting out of chair: Denies Difficulty using hands for taps, buttons, cutlery, and/or writing: Denies  Review of Systems  Constitutional: Positive for fatigue. Negative for night sweats, weight gain and weight loss.  HENT: Positive for nose  dryness. Negative for mouth sores, trouble swallowing, trouble swallowing and mouth dryness.   Eyes: Negative for pain, redness, itching, visual disturbance and dryness.  Respiratory: Negative for cough, shortness of breath and difficulty breathing.   Cardiovascular: Negative for chest pain, palpitations, hypertension, irregular heartbeat and swelling in legs/feet.  Gastrointestinal: Positive for blood in stool. Negative for constipation and diarrhea.       History of hemorrhoids  Endocrine: Negative for increased urination.  Genitourinary: Negative for difficulty urinating, painful urination and vaginal dryness.  Musculoskeletal: Positive for arthralgias, joint pain, joint swelling and morning stiffness. Negative for myalgias, muscle weakness, muscle tenderness and myalgias.  Skin: Positive for rash. Negative for color change, hair loss, skin tightness, ulcers and sensitivity to sunlight.       eczema  Allergic/Immunologic: Negative for susceptible to infections.  Neurological: Positive for headaches. Negative for dizziness, numbness, memory loss, night sweats and weakness.  Hematological: Negative for bruising/bleeding tendency and swollen glands.  Psychiatric/Behavioral: Positive for sleep disturbance. Negative for depressed mood and confusion. The patient is not nervous/anxious.     PMFS History:  Patient Active Problem List   Diagnosis Date Noted  . History of diabetes mellitus, type II 05/02/2019  . SIRS (systemic inflammatory response syndrome) ([facility]) 10/20/2018  . Acute on chronic respiratory failure with hypoxia (HCC)  10/19/2018  . Type 2 diabetes mellitus without complication ([facility]) 39/53/2023  . Rheumatoid arthritis ([facility]) 10/19/2018  . Pneumonia due to COVID-19 virus 10/17/2018  . DUB (dysfunctional uterine bleeding) 01/24/2011  . Thickened endometrium 01/24/2011    Past Medical History:  Diagnosis Date  . Abnormal vaginal bleeding   . Anemia   . Asthma    seasonal  asthma-using inhaler daily at present  . Complication of anesthesia    panic attack after breast biopsy 20 years ago  . DM II (diabetes mellitus, type II), controlled ([facility])    metformin '500mg'$  qd  . Fibroids   . GERD (gastroesophageal reflux disease)    occasional indigestion-tums prn  . Heart murmur   . Rheumatoid arthritis ([facility])     Family History  Problem Relation Age of Onset  . Heart disease Mother   . Diabetes Maternal Grandmother   . Prostate cancer Father   . Healthy Sister   . Healthy Brother   . Healthy Brother   . Healthy Son   . Diabetes Daughter   . Healthy Daughter   . Healthy Daughter    Past Surgical History:  Procedure Laterality Date  . ABDOMINAL HYSTERECTOMY    . BIOPSY BREAST  age 52   benign   . CYSTOSCOPY  10/13/2011   Procedure: CYSTOSCOPY;  Surgeon: Emily Filbert, MD;  Location: [facility];  Service: Gynecology;  Laterality: N/A;  . TUBAL LIGATION     Social History   Social History Narrative  . Not on file   Immunization History  Administered Date(s) Administered  . Influenza Split 08/22/2011  . Pneumococcal Polysaccharide-23 10/13/2011     Objective: Vital Signs: BP 140/87 (BP Location: Right Wrist, Patient Position: Sitting, Cuff Size: Normal)   Pulse 82   Resp 15   Ht '5\' 4"'$  (1.626 m)   Wt (!) 325 lb (147.4 kg)   LMP 07/09/2011   BMI 55.79 kg/m    Physical Exam Vitals signs and nursing note reviewed.  Constitutional:      Appearance: She is well-developed.  HENT:     Head: Normocephalic and atraumatic.  Eyes:     Conjunctiva/sclera: Conjunctivae normal.  Neck:     Musculoskeletal: Normal range of motion.  Cardiovascular:     Rate and Rhythm: Normal rate and regular rhythm.     Heart sounds: Normal heart sounds.  Pulmonary:     Effort: Pulmonary effort is normal.     Breath sounds: Normal breath sounds.  Abdominal:     General: Bowel sounds are normal.     Palpations: Abdomen is soft.  Lymphadenopathy:     Cervical: No  cervical adenopathy.  Skin:    General: Skin is warm and dry.     Capillary Refill: Capillary refill takes less than 2 seconds.  Neurological:     Mental Status: She is alert and oriented to person, place, and time.  Psychiatric:        Behavior: Behavior normal.      Musculoskeletal Exam: Patient had good range of motion of her cervical spine.  She had good range of motion of her lumbar spine.  She describes some pain over sacral region.  She had good range of motion of bilateral shoulder joints with discomfort.  Elbow joints wrist joint MCPs PIPs DIPs with good range of motion.  She has some tenderness over PIP joints.  She had discomfort range of motion of bilateral hip joints.  She had good range of motion of  bilateral knee joints without any warmth swelling or effusion.  She has tenderness over ankle joints without any swelling.  She has tenderness across the MTP joints without any synovitis or swelling.  She has bilateral pes planus.  CDAI Exam: CDAI Score: - Patient Global: -; Provider Global: - Swollen: -; Tender: - Joint Exam   No joint exam has been documented for this visit   There is currently no information documented on the homunculus. Go to the Rheumatology activity and complete the homunculus joint exam.  Investigation: No additional findings.  Imaging: Xr Hips Bilat W Or W/o Pelvis 3-4 Views  Result Date: 05/11/2019 No hip joint narrowing was noted.  No SI joint changes were noted.  No chondrocalcinosis was noted. Impression: Unremarkable x-ray of the hip joints.  Xr Foot 2 Views Left  Result Date: 05/11/2019 First MTP narrowing PIP and DIP narrowing was noted.  No intertarsal or tibiotalar joint space narrowing was noted.  Inferior and posterior calcaneal spurs were noted. Impression: These findings are consistent with osteoarthritis of the foot.  Xr Foot 2 Views Right  Result Date: 05/11/2019 PIP and DIP narrowing was noted.  No MTP changes were noted.  No  erosive changes were noted.  No intertarsal or tibiotalar joint space narrowing was noted.  Small calcaneal spur was noted. Impression: These findings are consistent with osteoarthritis of the foot.  Xr Hand 2 View Left  Result Date: 05/11/2019 No MCP PIP or DIP narrowing was noted.  No intercarpal or radiocarpal joint space narrowing was noted.  No erosive changes were noted. Impression: Unremarkable x-ray of the hand.  Xr Hand 2 View Right  Result Date: 05/11/2019 No MCP PIP or DIP narrowing was noted.  No intercarpal or radiocarpal joint space narrowing was noted.  No erosive changes were noted. Impression: Unremarkable x-ray of the hand.  Xr Shoulder Left  Result Date: 05/11/2019 No glenohumeral or acromioclavicular joint space narrowing was noted.  No chondrocalcinosis was noted. Impression: Unremarkable x-ray of the shoulder joint.  Xr Shoulder Right  Result Date: 05/11/2019 No glenohumeral or acromioclavicular joint space narrowing was noted.  Calcification in the rotator cuff was noted. Impression: Unremarkable x-ray of the shoulder joint except for calcification next to the acromion which could be contributing to her symptoms.   Recent Labs: Lab Results  Component Value Date   WBC 8.2 10/19/2018   HGB 12.6 10/19/2018   PLT 452 (H) 10/19/2018   NA 137 10/20/2018   K 3.5 10/20/2018   CL 101 10/20/2018   CO2 27 10/20/2018   GLUCOSE 183 (H) 10/20/2018   BUN 11 10/20/2018   CREATININE 0.55 10/20/2018   BILITOT 0.5 10/20/2018   ALKPHOS 65 10/20/2018   AST 34 10/20/2018   ALT 20 10/20/2018   PROT 7.0 10/20/2018   ALBUMIN 2.7 (L) 10/20/2018   CALCIUM 8.6 (L) 10/20/2018   GFRAA >60 10/20/2018    Speciality Comments: No specialty comments available.  Procedures:  No procedures performed Allergies: Patient has no known allergies.   Assessment / Plan:     Visit Diagnoses: Chronic pain of both shoulders -patient states she has had symptoms for last 11 years.  She  states that she was diagnosed with rheumatoid arthritis in [place] 11 years ago.  She has had no treatment for rheumatoid arthritis had no follow-up since then.  She has been taking over-the-counter medications for pain relief.  Plan: XR Shoulder Left, XR Shoulder Right, calcification in the right shoulder joint tendon next to the  acromion was noted.  No lateral humeral joint space narrowing was noted.  ESR, CK (Creatine Kinase), RF, CCP, 14-3-3 eta Protein, ANA, ACE  Pain in both hands -she has some discomfort on palpation of PIP joints but no synovitis was noted.  Plan: XR Hand 2 View Right, XR Hand 2 View Left.  The x-rays were unremarkable.  Chronic pain of both hips -she complains of bilateral hip joint pain.  Plan: XR HIPS BILAT W OR W/O PELVIS 3-4 VIEWS.  She had no hip joint narrowing.  Pain in both feet -she complains of pain in her bilateral feet.  She had some dorsal spurring.  Plan: XR Foot 2 Views Right, XR Foot 2 Views Left.  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  Plantar fascial fibromatosis  Pes planus of both feet-proper fitting shoes with arch support were discussed.  Type 2 diabetes mellitus without complication, with long-term current use of insulin (HCC)-she has some diabetic neuropathy as well.  Acute on chronic respiratory failure with hypoxia (HCC) - with COVID -19  Pneumonia due to COVID-19 virus - 09/2018  Family history of systemic lupus erythematosus - First cousin  Orders: Orders Placed This Encounter  Procedures  . XR Shoulder Left  . XR Shoulder Right  . XR HIPS BILAT W OR W/O PELVIS 3-4 VIEWS  . XR Foot 2 Views Right  . XR Foot 2 Views Left  . XR Hand 2 View Right  . XR Hand 2 View Left  . ESR  . CK (Creatine Kinase)  . RF  . CCP  . 14-3-3 eta Protein  . ANA  . ACE   No orders of the defined types were placed in this encounter.   Face-to-face time spent with patient was 50 minutes. Greater than 50% of time was spent in counseling and  coordination of care.  Follow-Up Instructions: Return for Polyarthralgia.   Bo Merino, MD  Note - This record has been created using Editor, commissioning.  Chart creation errors have been sought, but may not always  have been located. Such creation errors do not reflect on  the standard of medical care.

## 2019-05-11 ENCOUNTER — Ambulatory Visit (INDEPENDENT_AMBULATORY_CARE_PROVIDER_SITE_OTHER): Payer: 59 | Admitting: Rheumatology

## 2019-05-11 ENCOUNTER — Ambulatory Visit: Payer: Self-pay

## 2019-05-11 ENCOUNTER — Encounter: Payer: Self-pay | Admitting: Rheumatology

## 2019-05-11 ENCOUNTER — Other Ambulatory Visit: Payer: Self-pay

## 2019-05-11 VITALS — BP 140/87 | HR 82 | Resp 15 | Ht 64.0 in | Wt 325.0 lb

## 2019-05-11 DIAGNOSIS — M79671 Pain in right foot: Secondary | ICD-10-CM

## 2019-05-11 DIAGNOSIS — Z8269 Family history of other diseases of the musculoskeletal system and connective tissue: Secondary | ICD-10-CM

## 2019-05-11 DIAGNOSIS — M25552 Pain in left hip: Secondary | ICD-10-CM

## 2019-05-11 DIAGNOSIS — M79641 Pain in right hand: Secondary | ICD-10-CM

## 2019-05-11 DIAGNOSIS — M79672 Pain in left foot: Secondary | ICD-10-CM | POA: Diagnosis not present

## 2019-05-11 DIAGNOSIS — M25512 Pain in left shoulder: Secondary | ICD-10-CM | POA: Diagnosis not present

## 2019-05-11 DIAGNOSIS — M79642 Pain in left hand: Secondary | ICD-10-CM | POA: Diagnosis not present

## 2019-05-11 DIAGNOSIS — M25551 Pain in right hip: Secondary | ICD-10-CM

## 2019-05-11 DIAGNOSIS — J1282 Pneumonia due to coronavirus disease 2019: Secondary | ICD-10-CM

## 2019-05-11 DIAGNOSIS — U071 COVID-19: Secondary | ICD-10-CM

## 2019-05-11 DIAGNOSIS — M2141 Flat foot [pes planus] (acquired), right foot: Secondary | ICD-10-CM

## 2019-05-11 DIAGNOSIS — M25511 Pain in right shoulder: Secondary | ICD-10-CM

## 2019-05-11 DIAGNOSIS — M2142 Flat foot [pes planus] (acquired), left foot: Secondary | ICD-10-CM

## 2019-05-11 DIAGNOSIS — M722 Plantar fascial fibromatosis: Secondary | ICD-10-CM

## 2019-05-11 DIAGNOSIS — J9621 Acute and chronic respiratory failure with hypoxia: Secondary | ICD-10-CM

## 2019-05-11 DIAGNOSIS — Z794 Long term (current) use of insulin: Secondary | ICD-10-CM

## 2019-05-11 DIAGNOSIS — E119 Type 2 diabetes mellitus without complications: Secondary | ICD-10-CM

## 2019-05-11 DIAGNOSIS — J1289 Other viral pneumonia: Secondary | ICD-10-CM

## 2019-05-11 DIAGNOSIS — G8929 Other chronic pain: Secondary | ICD-10-CM

## 2019-05-13 NOTE — Progress Notes (Signed)
Patient's labs are consistent with rheumatoid arthritis.  If there is an opening to bring her sooner, please give her an earlier appointment

## 2019-05-15 LAB — CK: Total CK: 53 U/L (ref 29–143)

## 2019-05-15 LAB — ANA: Anti Nuclear Antibody (ANA): NEGATIVE

## 2019-05-15 LAB — SEDIMENTATION RATE: Sed Rate: 50 mm/h — ABNORMAL HIGH (ref 0–20)

## 2019-05-15 LAB — 14-3-3 ETA PROTEIN: 14-3-3 eta Protein: <0.2 ng/mL (ref <0.2)

## 2019-05-15 LAB — ANGIOTENSIN CONVERTING ENZYME: Angiotensin-Converting Enzyme: 26 U/L (ref 9–67)

## 2019-05-15 LAB — RHEUMATOID FACTOR: Rheumatoid fact SerPl-aCnc: <14 IU/mL (ref <14)

## 2019-05-15 LAB — CYCLIC CITRUL PEPTIDE ANTIBODY, IGG: Cyclic Citrullin Peptide Ab: 229 UNITS — ABNORMAL HIGH

## 2019-05-17 ENCOUNTER — Other Ambulatory Visit: Payer: Self-pay

## 2019-05-17 ENCOUNTER — Telehealth (INDEPENDENT_AMBULATORY_CARE_PROVIDER_SITE_OTHER): Payer: 59 | Admitting: Rheumatology

## 2019-05-17 ENCOUNTER — Telehealth: Payer: Self-pay

## 2019-05-17 ENCOUNTER — Encounter: Payer: Self-pay | Admitting: Rheumatology

## 2019-05-17 DIAGNOSIS — M79642 Pain in left hand: Secondary | ICD-10-CM

## 2019-05-17 DIAGNOSIS — Z794 Long term (current) use of insulin: Secondary | ICD-10-CM

## 2019-05-17 DIAGNOSIS — M25512 Pain in left shoulder: Secondary | ICD-10-CM

## 2019-05-17 DIAGNOSIS — M0609 Rheumatoid arthritis without rheumatoid factor, multiple sites: Secondary | ICD-10-CM

## 2019-05-17 DIAGNOSIS — M25551 Pain in right hip: Secondary | ICD-10-CM

## 2019-05-17 DIAGNOSIS — M19072 Primary osteoarthritis, left ankle and foot: Secondary | ICD-10-CM

## 2019-05-17 DIAGNOSIS — J1289 Other viral pneumonia: Secondary | ICD-10-CM

## 2019-05-17 DIAGNOSIS — M25511 Pain in right shoulder: Secondary | ICD-10-CM | POA: Diagnosis not present

## 2019-05-17 DIAGNOSIS — U071 COVID-19: Secondary | ICD-10-CM

## 2019-05-17 DIAGNOSIS — M722 Plantar fascial fibromatosis: Secondary | ICD-10-CM

## 2019-05-17 DIAGNOSIS — E119 Type 2 diabetes mellitus without complications: Secondary | ICD-10-CM

## 2019-05-17 DIAGNOSIS — M2141 Flat foot [pes planus] (acquired), right foot: Secondary | ICD-10-CM

## 2019-05-17 DIAGNOSIS — Z79899 Other long term (current) drug therapy: Secondary | ICD-10-CM

## 2019-05-17 DIAGNOSIS — J9621 Acute and chronic respiratory failure with hypoxia: Secondary | ICD-10-CM

## 2019-05-17 DIAGNOSIS — M79641 Pain in right hand: Secondary | ICD-10-CM

## 2019-05-17 DIAGNOSIS — M2142 Flat foot [pes planus] (acquired), left foot: Secondary | ICD-10-CM

## 2019-05-17 DIAGNOSIS — M25552 Pain in left hip: Secondary | ICD-10-CM

## 2019-05-17 DIAGNOSIS — G8929 Other chronic pain: Secondary | ICD-10-CM

## 2019-05-17 DIAGNOSIS — Z8269 Family history of other diseases of the musculoskeletal system and connective tissue: Secondary | ICD-10-CM

## 2019-05-17 DIAGNOSIS — J1282 Pneumonia due to coronavirus disease 2019: Secondary | ICD-10-CM

## 2019-05-17 DIAGNOSIS — M19071 Primary osteoarthritis, right ankle and foot: Secondary | ICD-10-CM

## 2019-05-17 NOTE — Patient Instructions (Signed)
Standing Labs We placed an order today for your standing lab work.    Please come back and get your standing labs in 1 month and then every 3 months.   We have open lab daily Monday through Thursday from 8:30-12:30 PM and 1:30-4:30 PM and Friday from 8:30-12:30 PM and 1:30-4:00 PM at the office of Dr. Bo Merino.   You may experience shorter wait times on Monday and Friday afternoons. The office is located at [address], [place], [place], [place] [address] No appointment is necessary.   Labs are drawn by [facility].  You may receive a bill from [facility] for your lab work.  If you wish to have your labs drawn at another location, please call the office 24 hours in advance to send orders.  If you have any questions regarding directions or hours of operation,  please call [phone].   Just as a reminder please drink plenty of water prior to coming for your lab work. Thanks!    Hydroxychloroquine tablets What is this medicine? HYDROXYCHLOROQUINE (hye drox ee KLOR oh kwin) is used to treat rheumatoid arthritis and systemic lupus erythematosus. It is also used to treat malaria. This medicine may be used for other purposes; ask your health care provider or pharmacist if you have questions. COMMON BRAND NAME(S): Plaquenil, Quineprox What should I tell my health care provider before I take this medicine? They need to know if you have any of these conditions:  diabetes  eye disease, vision problems  G6PD deficiency  heart disease  history of irregular heartbeat  if you often drink alcohol  kidney disease  liver disease  porphyria  psoriasis  an unusual or allergic reaction to chloroquine, hydroxychloroquine, other medicines, foods, dyes, or preservatives  pregnant or trying to get pregnant  breast-feeding How should I use this medicine? Take this medicine by mouth with a glass of water. Follow the directions on the prescription label. Do not cut, crush or chew  this medicine. Swallow the tablets whole. Take this medicine with food. Avoid taking antacids within 4 hours of taking this medicine. It is best to separate these medicines by at least 4 hours. Take your medicine at regular intervals. Do not take it more often than directed. Take all of your medicine as directed even if you think you are better. Do not skip doses or stop your medicine early. Talk to your pediatrician regarding the use of this medicine in children. While this drug may be prescribed for selected conditions, precautions do apply. Overdosage: If you think you have taken too much of this medicine contact a poison control center or emergency room at once. NOTE: This medicine is only for you. Do not share this medicine with others. What if I miss a dose? If you miss a dose, take it as soon as you can. If it is almost time for your next dose, take only that dose. Do not take double or extra doses. What may interact with this medicine? Do not take this medicine with any of the following medications:  cisapride  dronedarone  pimozide  thioridazine This medicine may also interact with the following medications:  ampicillin  antacids  cimetidine  cyclosporine  digoxin  kaolin  medicines for diabetes, like insulin, glipizide, glyburide  medicines for seizures like carbamazepine, phenobarbital, phenytoin  mefloquine  methotrexate  other medicines that prolong the QT interval (cause an abnormal heart rhythm)  praziquantel This list may not describe all possible interactions. Give your health care provider  a list of all the medicines, herbs, non-prescription drugs, or dietary supplements you use. Also tell them if you smoke, drink alcohol, or use illegal drugs. Some items may interact with your medicine. What should I watch for while using this medicine? Visit your health care professional for regular checks on your progress. Tell your health care professional if your  symptoms do not start to get better or if they get worse. You may need blood work done while you are taking this medicine. If you take other medicines that can affect heart rhythm, you may need more testing. Talk to your health care professional if you have questions. Your vision may be tested before and during use of this medicine. Tell your health care professional right away if you have any change in your eyesight. What side effects may I notice from receiving this medicine? Side effects that you should report to your doctor or health care professional as soon as possible:  allergic reactions like skin rash, itching or hives, swelling of the face, lips, or tongue  changes in vision  decreased hearing or ringing of the ears  muscle weakness  redness, blistering, peeling or loosening of the skin, including inside the mouth  sensitivity to light  signs and symptoms of a dangerous change in heartbeat or heart rhythm like chest pain; dizziness; fast or irregular heartbeat; palpitations; feeling faint or lightheaded, falls; breathing problems  signs and symptoms of liver injury like dark yellow or brown urine; general ill feeling or flu-like symptoms; light-colored stools; loss of appetite; nausea; right upper belly pain; unusually weak or tired; yellowing of the eyes or skin  signs and symptoms of low blood sugar such as feeling anxious; confusion; dizziness; increased hunger; unusually weak or tired; sweating; shakiness; cold; irritable; headache; blurred vision; fast heartbeat; loss of consciousness  suicidal thoughts  uncontrollable head, mouth, neck, arm, or leg movements Side effects that usually do not require medical attention (report to your doctor or health care professional if they continue or are bothersome):  diarrhea  dizziness  hair loss  headache  irritable  loss of appetite  nausea, vomiting  stomach pain This list may not describe all possible side effects.  Call your doctor for medical advice about side effects. You may report side effects to FDA at 1-800-FDA-1088. Where should I keep my medicine? Keep out of the reach of children. Store at room temperature between 15 and 30 degrees C (59 and 86 degrees F). Protect from moisture and light. Throw away any unused medicine after the expiration date. NOTE: This sheet is a summary. It may not cover all possible information. If you have questions about this medicine, talk to your doctor, pharmacist, or health care provider.  2020 Elsevier/Gold Standard (2018-10-18 12:56:32)

## 2019-05-17 NOTE — Progress Notes (Signed)
Virtual Visit via Telephone Note  I connected with Kaitlyn Rodgers on 05/17/19 at  3:30 PM EST by telephone and verified that I am speaking with the correct person using two identifiers.  Location: Patient: Home  Provider: Clinic  This service was conducted via virtual visit. The patient was located at home. I was located in my office.  Consent was obtained prior to the virtual visit and is aware of possible charges through their insurance for this visit.  The patient is an established patient.  Dr. Estanislado Pandy, MD conducted the virtual visit and Hazel Sams, PA-C acted as scribe during the service.  Office staff helped with scheduling follow up visits after the service was conducted.   I discussed the limitations, risks, security and privacy concerns of performing an evaluation and management service by telephone and the availability of in person appointments. I also discussed with the patient that there may be a patient responsible charge related to this service. The patient expressed understanding and agreed to proceed.  CC: Discuss lab work  History of Present Illness: Patient is a 43 year old female with a past medical history of seronegative rheumatoid arthritis and osteoarthritis.  She continues to have persistent pain in both shoulder joints, both hip joints, and both knee joints.  She has intermittent discomfort in both hands.  She has morning stiffness lasting about 5-10 minutes.  She denies any joint swelling.   She rates her RA a 4/10.    Review of Systems  Constitutional: Positive for malaise/fatigue. Negative for fever.  HENT:       +Dry mouth  Eyes: Negative for photophobia, pain, discharge and redness.  Respiratory: Negative for cough, shortness of breath and wheezing.   Cardiovascular: Negative for chest pain and palpitations.  Gastrointestinal: Negative for blood in stool, constipation and diarrhea.  Genitourinary: Negative for dysuria.  Musculoskeletal: Positive for joint  pain. Negative for back pain, myalgias and neck pain.       +Morning stiffness   Skin: Negative for rash.  Neurological: Negative for dizziness and headaches.  Psychiatric/Behavioral: Negative for depression. The patient is nervous/anxious and has insomnia.       Observations/Objective: Physical Exam  Constitutional: She is oriented to person, place, and time.  Neurological: She is alert and oriented to person, place, and time.  Psychiatric: Mood, memory, affect and judgment normal.    Patient reports morning stiffness for 5-10 minutes.   Patient denies nocturnal pain.  Difficulty dressing/grooming: Denies Difficulty climbing stairs: Denies Difficulty getting out of chair: Denies Difficulty using hands for taps, buttons, cutlery, and/or writing: Denies   Labs done on May 11, 2019 ESR 50, CK 53, RF negative, anti-CCP 229, _0 eta negative, ANA negative, ACE 26  Assessment and Plan: Rheumatoid arthritis of multiple sites with negative rheumatoid factor (Kaitlyn Rodgers): Positive anti-CCP, RF negative, _1 eta negative, elevated sed rate.  Diagnosed with rheumatoid arthritis in [place] 11 years ago but did not have any follow up: She has ongoing pain in both hands, both shoulder joints, both hips, and both knee joints.  She is not experiencing any joint swelling currently.  She has not taken any immunosuppressive agents in the past.  She will starting on Plaquenil 200 mg 1 tablet by mouth twice daily. Indications, contraindications, and potential side effects of PLQ were discussed.  All questions were addressed and she will come by the office to signs the consent form.  She will require CBC and CMP prior to starting on  PLQ.  Prescription will be sent in pending lab results.  Standing orders placed today.  She was advised to notify us if she cannot tolerate taking PLQ.  She will follow up in 1 month.   Patient was counseled on the purpose, proper use, and adverse effects of hydroxychloroquine  including nausea/diarrhea, skin rash, headaches, and sun sensitivity.  Discussed importance of annual eye exams while on hydroxychloroquine to monitor to ocular toxicity and discussed importance of frequent laboratory monitoring.  Provided patient with eye exam form for baseline ophthalmologic exam.  Provided patient with educational materials on hydroxychloroquine and answered all questions.  Patient consented to hydroxychloroquine.  Will upload consent in the media tab.    Dose will be Plaquenil 200 mg twice daily.  Prescription pending lab results.  High risk medications use: Plaquenil 200 mg 1 tablet by mouth twice daily.  She will follow up for lab work in 1 month, 3 months, then every 5 months. Standing orders were placed today. She will require a baseline plaquenil exam within 1 month of starting on PLQ.   Chronic pain of both shoulders: Calcific tendinosis noted.  She has chronic pain in both shoulder joints.   Chronic pain of both hips: She has persistent pain in both hip joints.   Primary osteoarthritis of both feet: She has occasional discomfort in both feet.  She has no joint swelling.   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   Pes planus of both feet-proper fitting shoes with arch support were discussed previously.   Other medical conditions are listed as follows:   Type 2 diabetes mellitus without complication, with long-term current use of insulin (HCC)-she has some diabetic neuropathy as well.  Acute on chronic respiratory failure with hypoxia (HCC) - with COVID -19  Pneumonia due to COVID-19 virus - 09/2018  Family history of systemic lupus erythematosus - First cousin  Follow Up Instructions: She will follow up in 1 month.    I discussed the assessment and treatment plan with the patient. The patient was provided an opportunity to ask questions and all were answered. The patient agreed with the plan and demonstrated an understanding of the  instructions.   The patient was advised to call back or seek an in-person evaluation if the symptoms worsen or if the condition fails to improve as anticipated.  I provided 30 minutes of non-face-to-face time during this encounter.   Bo Merino, MD   Scribed by-  Hazel Sams, PA-C

## 2019-05-17 NOTE — Telephone Encounter (Signed)
PLQ prescription pending lab results.

## 2019-05-18 ENCOUNTER — Telehealth: Payer: Self-pay | Admitting: Rheumatology

## 2019-05-18 NOTE — Telephone Encounter (Signed)
LMOM for patient to call and schedule 1 month follow-up appointment.

## 2019-05-18 NOTE — Telephone Encounter (Signed)
-----   Message from [place] sent at 05/17/2019  3:59 PM EST ----- Patient had virtual visit today with Dr. Estanislado Pandy, please call to schedule follow up for 1 month. Thanks!

## 2019-05-27 LAB — CBC WITH DIFFERENTIAL/PLATELET
Absolute Monocytes: 566 cells/uL (ref 200–950)
Basophils Absolute: 20 cells/uL (ref 0–200)
Basophils Relative: 0.2 %
Eosinophils Absolute: 222 cells/uL (ref 15–500)
Eosinophils Relative: 2.2 %
HCT: 37.8 % (ref 35.0–45.0)
Hemoglobin: 12.6 g/dL (ref 11.7–15.5)
Lymphs Abs: 2889 cells/uL (ref 850–3900)
MCH: 28.6 pg (ref 27.0–33.0)
MCHC: 33.3 g/dL (ref 32.0–36.0)
MCV: 85.7 fL (ref 80.0–100.0)
MPV: 9.9 fL (ref 7.5–12.5)
Monocytes Relative: 5.6 %
Neutro Abs: 6403 cells/uL (ref 1500–7800)
Neutrophils Relative %: 63.4 %
Platelets: 465 10*3/uL — ABNORMAL HIGH (ref 140–400)
RBC: 4.41 10*6/uL (ref 3.80–5.10)
RDW: 13.2 % (ref 11.0–15.0)
Total Lymphocyte: 28.6 %
WBC: 10.1 10*3/uL (ref 3.8–10.8)

## 2019-05-27 LAB — COMPLETE METABOLIC PANEL WITH GFR
AG Ratio: 1.2 (calc) (ref 1.0–2.5)
ALT: 12 U/L (ref 6–29)
AST: 14 U/L (ref 10–30)
Albumin: 3.9 g/dL (ref 3.6–5.1)
Alkaline phosphatase (APISO): 61 U/L (ref 31–125)
BUN: 11 mg/dL (ref 7–25)
CO2: 25 mmol/L (ref 20–32)
Calcium: 9.5 mg/dL (ref 8.6–10.2)
Chloride: 103 mmol/L (ref 98–110)
Creat: 0.55 mg/dL (ref 0.50–1.10)
GFR, Est African American: 133 mL/min/{1.73_m2} (ref >=60)
GFR, Est Non African American: 115 mL/min/{1.73_m2} (ref >=60)
Globulin: 3.2 g/dL (calc) (ref 1.9–3.7)
Glucose, Bld: 102 mg/dL (ref 65–139)
Potassium: 4.4 mmol/L (ref 3.5–5.3)
Sodium: 140 mmol/L (ref 135–146)
Total Bilirubin: 0.4 mg/dL (ref 0.2–1.2)
Total Protein: 7.1 g/dL (ref 6.1–8.1)

## 2019-05-27 MED ORDER — HYDROXYCHLOROQUINE SULFATE 200 MG PO TABS: 200.0000 mg | ORAL_TABLET | Freq: Two times a day (BID) | ORAL | 0 refills | Status: DC

## 2019-05-27 NOTE — Telephone Encounter (Signed)
Labs: 05/26/2019 Plts are elevated but stable. Rest of CBC WNL. CMP WNL.   Advised patient of lab results, patient verbalized understanding. Plaquenil 200 mg twice daily,  prescription sent to the pharmacy. patient verbalized understanding.

## 2019-05-27 NOTE — Addendum Note (Signed)
Addended by: Francis Gaines C on: 05/27/2019 11:27 AM   Modules accepted: Orders

## 2019-05-27 NOTE — Progress Notes (Signed)
Plts are elevated but stable.  Rest of CBC WNL.  CMP WNL.

## 2019-06-01 ENCOUNTER — Ambulatory Visit: Payer: PRIVATE HEALTH INSURANCE | Admitting: Rheumatology

## 2019-07-14 ENCOUNTER — Other Ambulatory Visit: Payer: Self-pay | Admitting: Rheumatology

## 2019-07-14 DIAGNOSIS — M0609 Rheumatoid arthritis without rheumatoid factor, multiple sites: Secondary | ICD-10-CM

## 2019-07-14 MED ORDER — HYDROXYCHLOROQUINE SULFATE 200 MG PO TABS: 200.0000 mg | ORAL_TABLET | Freq: Two times a day (BID) | ORAL | 0 refills | Status: DC

## 2019-07-14 NOTE — Telephone Encounter (Signed)
Last Visit: 05/17/19 Next Visit: due December 2020. Message sent to the front to schedule patient  Labs: 05/26/19 Plts are elevated but stable. Rest of CBC WNL. CMP WNL.  Eye exam: Patient has her PLQ eye is scheduled for 07/15/19 and will have results faxed.   Okay to refill per Dr. Estanislado Pandy

## 2019-07-15 ENCOUNTER — Encounter: Payer: Self-pay | Admitting: Rheumatology

## 2019-08-28 ENCOUNTER — Ambulatory Visit (INDEPENDENT_AMBULATORY_CARE_PROVIDER_SITE_OTHER)
Admission: EM | Admit: 2019-08-28 | Discharge: 2019-08-28 | Disposition: A | Discharge status: Home / Self Care | Payer: 59 | Source: Home / Self Care

## 2019-08-28 ENCOUNTER — Emergency Department (HOSPITAL_COMMUNITY): Payer: 59

## 2019-08-28 ENCOUNTER — Emergency Department (HOSPITAL_COMMUNITY)
Admission: EM | Admit: 2019-08-28 | Discharge: 2019-08-28 | Disposition: A | Discharge status: Home / Self Care | Payer: 59 | Attending: Emergency Medicine | Admitting: Emergency Medicine

## 2019-08-28 ENCOUNTER — Other Ambulatory Visit: Payer: Self-pay

## 2019-08-28 ENCOUNTER — Encounter (HOSPITAL_COMMUNITY): Payer: Self-pay

## 2019-08-28 ENCOUNTER — Encounter (HOSPITAL_COMMUNITY): Payer: Self-pay | Admitting: Pharmacy Technician

## 2019-08-28 DIAGNOSIS — E119 Type 2 diabetes mellitus without complications: Secondary | ICD-10-CM | POA: Insufficient documentation

## 2019-08-28 DIAGNOSIS — R1011 Right upper quadrant pain: Secondary | ICD-10-CM

## 2019-08-28 DIAGNOSIS — K760 Fatty (change of) liver, not elsewhere classified: Secondary | ICD-10-CM | POA: Diagnosis not present

## 2019-08-28 DIAGNOSIS — Z79899 Other long term (current) drug therapy: Secondary | ICD-10-CM | POA: Diagnosis not present

## 2019-08-28 DIAGNOSIS — Z8616 Personal history of COVID-19: Secondary | ICD-10-CM | POA: Diagnosis not present

## 2019-08-28 DIAGNOSIS — R109 Unspecified abdominal pain: Secondary | ICD-10-CM

## 2019-08-28 DIAGNOSIS — M069 Rheumatoid arthritis, unspecified: Secondary | ICD-10-CM | POA: Insufficient documentation

## 2019-08-28 DIAGNOSIS — Z794 Long term (current) use of insulin: Secondary | ICD-10-CM | POA: Diagnosis not present

## 2019-08-28 DIAGNOSIS — K219 Gastro-esophageal reflux disease without esophagitis: Secondary | ICD-10-CM | POA: Insufficient documentation

## 2019-08-28 DIAGNOSIS — R071 Chest pain on breathing: Secondary | ICD-10-CM | POA: Diagnosis not present

## 2019-08-28 LAB — URINALYSIS, ROUTINE W REFLEX MICROSCOPIC
Bilirubin Urine: NEGATIVE
Glucose, UA: NEGATIVE mg/dL
Hgb urine dipstick: NEGATIVE
Ketones, ur: NEGATIVE mg/dL
Leukocytes,Ua: NEGATIVE
Nitrite: NEGATIVE
Protein, ur: NEGATIVE mg/dL
Specific Gravity, Urine: 1.03 (ref 1.005–1.030)
pH: 5 (ref 5.0–8.0)

## 2019-08-28 LAB — CBC
HCT: 40.7 % (ref 36.0–46.0)
Hemoglobin: 12.4 g/dL (ref 12.0–15.0)
MCH: 27.3 pg (ref 26.0–34.0)
MCHC: 30.5 g/dL (ref 30.0–36.0)
MCV: 89.6 fL (ref 80.0–100.0)
Platelets: 435 10*3/uL — ABNORMAL HIGH (ref 150–400)
RBC: 4.54 MIL/uL (ref 3.87–5.11)
RDW: 13.2 % (ref 11.5–15.5)
WBC: 8.1 10*3/uL (ref 4.0–10.5)
nRBC: 0 % (ref 0.0–0.2)

## 2019-08-28 LAB — COMPREHENSIVE METABOLIC PANEL
ALT: 21 U/L (ref 0–44)
AST: 19 U/L (ref 15–41)
Albumin: 3.5 g/dL (ref 3.5–5.0)
Alkaline Phosphatase: 59 U/L (ref 38–126)
Anion gap: 9 (ref 5–15)
BUN: 14 mg/dL (ref 6–20)
CO2: 29 mmol/L (ref 22–32)
Calcium: 9.7 mg/dL (ref 8.9–10.3)
Chloride: 101 mmol/L (ref 98–111)
Creatinine, Ser: 0.58 mg/dL (ref 0.44–1.00)
GFR calc Af Amer: >60 mL/min (ref >60)
GFR calc non Af Amer: >60 mL/min (ref >60)
Glucose, Bld: 130 mg/dL — ABNORMAL HIGH (ref 70–99)
Potassium: 4.5 mmol/L (ref 3.5–5.1)
Sodium: 139 mmol/L (ref 135–145)
Total Bilirubin: 0.6 mg/dL (ref 0.3–1.2)
Total Protein: 7.7 g/dL (ref 6.5–8.1)

## 2019-08-28 LAB — D-DIMER, QUANTITATIVE: D-Dimer, Quant: 1.83 ug/mL-FEU — ABNORMAL HIGH (ref 0.00–0.50)

## 2019-08-28 LAB — LIPASE, BLOOD: Lipase: 23 U/L (ref 11–51)

## 2019-08-28 MED ORDER — SODIUM CHLORIDE 0.9% FLUSH: 3.0000 mL | Freq: Once | INTRAVENOUS | Status: DC

## 2019-08-28 MED ORDER — KETOROLAC TROMETHAMINE 60 MG/2ML IM SOLN
INTRAMUSCULAR | Status: AC
  Filled 2019-08-28: qty 2

## 2019-08-28 MED ORDER — KETOROLAC TROMETHAMINE 60 MG/2ML IM SOLN
60.0000 mg | Freq: Once | INTRAMUSCULAR | Status: AC
  Administered 2019-08-28: 15:00:00 60 mg via INTRAMUSCULAR

## 2019-08-28 MED ORDER — IOHEXOL 350 MG/ML SOLN
100.0000 mL | Freq: Once | INTRAVENOUS | Status: AC | PRN
  Administered 2019-08-28: 100 mL via INTRAVENOUS

## 2019-08-28 NOTE — Discharge Instructions (Signed)
Go to the ER for further imaging.

## 2019-08-28 NOTE — Discharge Instructions (Addendum)
Use Tylenol and Motrin as needed for pain.  Follow-up closely with your primary doctor.  Your CT scan did not show any large blood clot however it cannot rule out the chance of small blood clots.  You might need further studies including ultrasound and V/Q studies that your doctor can order.  Return for worsening symptoms.

## 2019-08-28 NOTE — ED Provider Notes (Signed)
[facility]    CSN: [id number] Arrival date & time: 08/28/19  1320      History   Chief Complaint Chief Complaint  Patient presents with  . Chest Pain    HPI Kaitlyn Rodgers is a 43 y.o. female.   Reports right upper quadrant pain it has been occurring for about a week.  Patient states that some minimal relief comes with belching, passing gas, having a bowel movement.  Reports that the pain is always there, and that the relief is temporary, about 15 to 20 minutes at the most.  Patient has history significant for rheumatoid arthritis and type 2 diabetes.  Reports that her stools have been looser than usual, but have not changed in color.  Denies any treatments at home.  Denies fever, headache, body aches, nausea, vomiting, diarrhea, chills, rash, other symptoms.  ROS Per HPI  The history is provided by the patient.  Chest Pain   Past Medical History:  Diagnosis Date  . Abnormal vaginal bleeding   . Anemia   . Asthma    seasonal asthma-using inhaler daily at present  . Complication of anesthesia    panic attack after breast biopsy 20 years ago  . DM II (diabetes mellitus, type II), controlled ([place])    metformin 500mg  qd  . Fibroids   . GERD (gastroesophageal reflux disease)    occasional indigestion-tums prn  . Heart murmur   . Rheumatoid arthritis [facility])     Patient Active Problem List   Diagnosis Date Noted  . History of diabetes mellitus, type II 05/02/2019  . SIRS (systemic inflammatory response syndrome) ([place]) 10/20/2018  . Acute on chronic respiratory failure with hypoxia ([place]) 10/19/2018  . Type 2 diabetes mellitus without complication ([place]) 99991111  . Rheumatoid arthritis ([place]) 10/19/2018  . Pneumonia due to COVID-19 virus 10/17/2018  . DUB (dysfunctional uterine bleeding) 01/24/2011  . Thickened endometrium 01/24/2011    Past Surgical History:  Procedure Laterality Date  . ABDOMINAL HYSTERECTOMY    . BIOPSY BREAST  age 6   benign   .  CYSTOSCOPY  10/13/2011   Procedure: CYSTOSCOPY;  Surgeon: Emily Filbert, MD;  Location: [facility];  Service: Gynecology;  Laterality: N/A;  . TUBAL LIGATION      OB History    Gravida  5   Para  4   Term  4   Preterm      AB  1   Living  4     SAB      TAB  1   Ectopic      Multiple      Live Births               Home Medications    Prior to Admission medications   Medication Sig Start Date End Date Taking? Authorizing Provider  budesonide-formoterol (SYMBICORT) 160-4.5 MCG/ACT inhaler Inhale 2 puffs into the lungs 2 (two) times daily.    [provider]  chlorhexidine (PERIDEX) 0.12 % solution Use as directed 15 mLs in the mouth or throat 2 (two) times daily.    [provider]  dapagliflozin propanediol (FARXIGA) 5 MG TABS tablet Take 5 mg by mouth daily.    [provider]  Dulaglutide (TRULICITY [place]) Inject into the skin once a week.    [provider]  empagliflozin (JARDIANCE) 25 MG TABS tablet Take 25 mg by mouth daily.    [provider]  fluticasone (FLONASE) 50 MCG/ACT nasal spray Place 1 spray  into both nostrils daily.    [provider]  gabapentin (NEURONTIN) 300 MG capsule Take 300 mg by mouth 3 (three) times daily.    [provider]  hydroxychloroquine (PLAQUENIL) 200 MG tablet Take 1 tablet (200 mg total) by mouth 2 (two) times daily. 07/14/19   Bo Merino, MD  Insulin Glargine (BASAGLAR KWIKPEN) 100 UNIT/ML SOPN Inject 0.2 mLs (20 Units total) into the skin 2 (two) times daily. 10/20/18   [name], MD  metFORMIN (GLUCOPHAGE) 500 MG tablet Take 500 mg by mouth daily.    [provider]  montelukast (SINGULAIR) 10 MG tablet Take 10 mg by mouth daily.    [provider]  sucralfate (CARAFATE) 1 g tablet Take 1 g by mouth 2 (two) times daily.    [provider]    Family History Family History  Problem Relation Age of Onset  . Heart disease Mother     . Diabetes Maternal Grandmother   . Prostate cancer Father   . Healthy Sister   . Healthy Brother   . Healthy Brother   . Healthy Son   . Diabetes Daughter   . Healthy Daughter   . Healthy Daughter     Social History Social History   Tobacco Use  . Smoking status: Never Smoker  . Smokeless tobacco: Never Used  Substance Use Topics  . Alcohol use: No  . Drug use: No     Allergies   Patient has no known allergies.   Review of Systems Review of Systems  Cardiovascular: Positive for chest pain.     Physical Exam Triage Vital Signs ED Triage Vitals [08/28/19 1425]  Enc Vitals Group     BP 126/82     Pulse Rate 81     Resp 18     Temp 98.2 F (36.8 C)     Temp Source Oral     SpO2 99 %     Weight      Height      Head Circumference      Peak Flow      Pain Score 7     Pain Loc      Pain Edu?      Excl. in [place]?    No data found.  Updated Vital Signs BP 126/82 (BP Location: Right Arm)   Pulse 81   Temp 98.2 F (36.8 C) (Oral)   Resp 18   LMP 07/09/2011   SpO2 99%   Visual Acuity Right Eye Distance:   Left Eye Distance:   Bilateral Distance:    Right Eye Near:   Left Eye Near:    Bilateral Near:     Physical Exam Vitals and nursing note reviewed.  Constitutional:      General: She is not in acute distress.    Appearance: She is well-developed. She is obese. She is ill-appearing.  HENT:     Head: Normocephalic and atraumatic.  Eyes:     Conjunctiva/sclera: Conjunctivae normal.  Cardiovascular:     Rate and Rhythm: Normal rate and regular rhythm.     Heart sounds: Normal heart sounds. No murmur.  Pulmonary:     Effort: Pulmonary effort is normal. No respiratory distress.     Breath sounds: Normal breath sounds. No stridor. No wheezing, rhonchi or rales.  Chest:     Chest wall: No tenderness.  Abdominal:     General: Bowel sounds are normal. There is no distension.     Palpations: Abdomen  is soft. There is no mass.     Tenderness:  There is guarding. There is no right CVA tenderness, left CVA tenderness or rebound.     Hernia: No hernia is present.     Comments: Right upper quadrant, positive Murphy sign, tenderness to ribs just above right upper quadrant as well.  Musculoskeletal:        General: Normal range of motion.     Cervical back: Neck supple.  Skin:    General: Skin is warm and dry.     Capillary Refill: Capillary refill takes less than 2 seconds.  Neurological:     General: No focal deficit present.     Mental Status: She is alert and oriented to person, place, and time.  Psychiatric:        Mood and Affect: Mood normal.        Behavior: Behavior normal.      UC Treatments / Results  Labs (all labs ordered are listed, but only abnormal results are displayed) Labs Reviewed - No data to display  EKG   Radiology No results found.  Procedures Procedures (including critical care time)  Medications Ordered in UC Medications  ketorolac (TORADOL) injection 60 mg (has no administration in time range)    Initial Impression / Assessment and Plan / UC Course  I have reviewed the triage vital signs and the nursing notes.  Pertinent labs & imaging results that were available during my care of the patient were reviewed by me and considered in my medical decision making (see chart for details).     Right upper quadrant pain, very tender to palpation.  Discussed options with patient, patient to go to ER for diagnostic imaging, further evaluation and treatment.  She is going via personal vehicle and her husband is driving. Final Clinical Impressions(s) / UC Diagnoses   Final diagnoses:  Abdominal pain, right upper quadrant     Discharge Instructions     Go to the ER for further imaging.     ED Prescriptions    None     PDMP not reviewed this encounter.   Faustino Congress, NP 08/28/19 1457

## 2019-08-28 NOTE — ED Notes (Signed)
Pt returned from CT °

## 2019-08-28 NOTE — ED Triage Notes (Signed)
Pt present right side pain by her rib cage underneath her right breast. Symptoms started a week ago.

## 2019-08-28 NOTE — ED Notes (Signed)
Patient verbalizes understanding of discharge instructions. Opportunity for questioning and answers were provided. Armband removed by staff, pt discharged from ED ambulatory to home.  

## 2019-08-28 NOTE — ED Triage Notes (Signed)
Pt reports RUQ abd pain onset a week ago. States pain has gradually gotten worse. Denies NVD.

## 2019-08-28 NOTE — ED Provider Notes (Signed)
[facility] EMERGENCY DEPARTMENT Provider Note   CSN: [id number] Arrival date & time: 08/28/19  1502     History Chief Complaint  Patient presents with  . Abdominal Pain    Kaitlyn Rodgers is a 44 y.o. female.  Patient with history of anemia, diabetes type 2, obesity reflux and rheumatoid arthritis presents with right flank pain intermittent for 1 week.  Patient did lift a heavy patient at work prior to this.  At times it is positional.  Patient also has mild pleuritic component.  Patient denies blood clot history or classic blood clot risk factors.  Patient denies pain after fatty foods or history of gallbladder pathology.  No history of kidney stones.  No urinary symptoms.  Patient seen in urgent care and sent over for further evaluation.        Past Medical History:  Diagnosis Date  . Abnormal vaginal bleeding   . Anemia   . Asthma    seasonal asthma-using inhaler daily at present  . Complication of anesthesia    panic attack after breast biopsy 20 years ago  . DM II (diabetes mellitus, type II), controlled ([place])    metformin 500mg  qd  . Fibroids   . GERD (gastroesophageal reflux disease)    occasional indigestion-tums prn  . Heart murmur   . Rheumatoid arthritis [facility])     Patient Active Problem List   Diagnosis Date Noted  . History of diabetes mellitus, type II 05/02/2019  . SIRS (systemic inflammatory response syndrome) ([place]) 10/20/2018  . Acute on chronic respiratory failure with hypoxia ([place]) 10/19/2018  . Type 2 diabetes mellitus without complication ([place]) 99991111  . Rheumatoid arthritis ([place]) 10/19/2018  . Pneumonia due to COVID-19 virus 10/17/2018  . DUB (dysfunctional uterine bleeding) 01/24/2011  . Thickened endometrium 01/24/2011    Past Surgical History:  Procedure Laterality Date  . ABDOMINAL HYSTERECTOMY    . BIOPSY BREAST  age 8   benign   . CYSTOSCOPY  10/13/2011   Procedure: CYSTOSCOPY;  Surgeon: Emily Filbert, MD;   Location: [facility];  Service: Gynecology;  Laterality: N/A;  . TUBAL LIGATION       OB History    Gravida  5   Para  4   Term  4   Preterm      AB  1   Living  4     SAB      TAB  1   Ectopic      Multiple      Live Births              Family History  Problem Relation Age of Onset  . Heart disease Mother   . Diabetes Maternal Grandmother   . Prostate cancer Father   . Healthy Sister   . Healthy Brother   . Healthy Brother   . Healthy Son   . Diabetes Daughter   . Healthy Daughter   . Healthy Daughter     Social History   Tobacco Use  . Smoking status: Never Smoker  . Smokeless tobacco: Never Used  Substance Use Topics  . Alcohol use: No  . Drug use: No    Home Medications Prior to Admission medications   Medication Sig Start Date End Date Taking? Authorizing Provider  acetaminophen (TYLENOL) 500 MG tablet Take 500 mg by mouth every 6 (six) hours as needed.   Yes [provider]  budesonide-formoterol (SYMBICORT) 160-4.5 MCG/ACT inhaler Inhale 2 puffs into the lungs 2 (  two) times daily.   Yes [provider]  dapagliflozin propanediol (FARXIGA) 5 MG TABS tablet Take 5 mg by mouth daily.   Yes [provider]  Dulaglutide (TRULICITY ) Inject into the skin once a week.   Yes [provider]  fluticasone (FLONASE) 50 MCG/ACT nasal spray Place 1 spray into both nostrils daily.   Yes [provider]  gabapentin (NEURONTIN) 300 MG capsule Take 300 mg by mouth 3 (three) times daily. 100 mg in the morning 100 mg afternoon 600 mg at bedtime   Yes [provider]  hydroxychloroquine (PLAQUENIL) 200 MG tablet Take 1 tablet (200 mg total) by mouth 2 (two) times daily. 07/14/19  Yes Deveshwar, Abel Presto, MD  ibuprofen (ADVIL) 200 MG tablet Take 600 mg by mouth every 6 (six) hours as needed for mild pain.   Yes [provider]  Insulin Glargine (BASAGLAR KWIKPEN) 100 UNIT/ML SOPN Inject 0.2 mLs (20 Units  total) into the skin 2 (two) times daily. Patient taking differently: Inject 14 Units into the skin at bedtime.  10/20/18  Yes [name], MD  metFORMIN (GLUCOPHAGE) 500 MG tablet Take 500 mg by mouth 2 (two) times daily with a meal.    Yes [provider]  montelukast (SINGULAIR) 10 MG tablet Take 10 mg by mouth daily.   Yes [provider]  pravastatin (PRAVACHOL) 40 MG tablet Take 40 mg by mouth daily.   Yes [provider]  sucralfate (CARAFATE) 1 g tablet Take 1 g by mouth 2 (two) times daily.   Yes [provider]  chlorhexidine (PERIDEX) 0.12 % solution Use as directed 15 mLs in the mouth or throat 2 (two) times daily.    [provider]  empagliflozin (JARDIANCE) 25 MG TABS tablet Take 25 mg by mouth daily.    [provider]    Allergies    Patient has no known allergies.  Review of Systems   Review of Systems  Constitutional: Negative for chills and fever.  HENT: Negative for congestion.   Eyes: Negative for visual disturbance.  Respiratory: Negative for shortness of breath.   Cardiovascular: Negative for chest pain.  Gastrointestinal: Positive for abdominal pain. Negative for vomiting.  Genitourinary: Positive for flank pain. Negative for dysuria.  Musculoskeletal: Negative for back pain, neck pain and neck stiffness.  Skin: Negative for rash.  Neurological: Negative for light-headedness and headaches.    Physical Exam Updated Vital Signs BP 123/67   Pulse 82   Temp 97.8 F (36.6 C)   Resp 15   Ht 5\' 4"  (1.626 m)   Wt (!) 145.2 kg   LMP 07/09/2011   SpO2 100%   BMI 54.93 kg/m   Physical Exam Vitals and nursing note reviewed.  Constitutional:      Appearance: She is well-developed.  HENT:     Head: Normocephalic and atraumatic.  Eyes:     General:        Right eye: No discharge.        Left eye: No discharge.     Conjunctiva/sclera: Conjunctivae normal.  Neck:     Trachea: No tracheal  deviation.  Cardiovascular:     Rate and Rhythm: Normal rate and regular rhythm.  Pulmonary:     Effort: Pulmonary effort is normal.     Breath sounds: Normal breath sounds.  Abdominal:     General: There is no distension.     Palpations: Abdomen is soft.     Tenderness: There is  abdominal tenderness (mild right flank). There is no guarding.  Musculoskeletal:     Cervical back: Normal range of motion and neck supple.     Comments: Mild tender to palpation right flank  Skin:    General: Skin is warm.     Findings: No rash.  Neurological:     Mental Status: She is alert and oriented to person, place, and time.     ED Results / Procedures / Treatments   Labs (all labs ordered are listed, but only abnormal results are displayed) Labs Reviewed  COMPREHENSIVE METABOLIC PANEL - Abnormal; Notable for the following components:      Result Value   Glucose, Bld 130 (*)    All other components within normal limits  CBC - Abnormal; Notable for the following components:   Platelets 435 (*)    All other components within normal limits  URINALYSIS, ROUTINE W REFLEX MICROSCOPIC - Abnormal; Notable for the following components:   APPearance HAZY (*)    All other components within normal limits  D-DIMER, QUANTITATIVE (NOT AT [facility]) - Abnormal; Notable for the following components:   D-Dimer, Quant 1.83 (*)    All other components within normal limits  LIPASE, BLOOD    EKG None  Radiology CT Angio Chest PE W and/or Wo Contrast  Result Date: 08/28/2019 CLINICAL DATA:  44 year old female with right-sided chest pain. EXAM: CT ANGIOGRAPHY CHEST WITH CONTRAST TECHNIQUE: Multidetector CT imaging of the chest was performed using the standard protocol during bolus administration of intravenous contrast. Multiplanar CT image reconstructions and MIPs were obtained to evaluate the vascular anatomy. CONTRAST:  161mL OMNIPAQUE IOHEXOL 350 MG/ML SOLN COMPARISON:  Chest radiograph dated 10/17/2018.  FINDINGS: Evaluation is limited due to streak artifact caused by patient's arms and body habitus. Cardiovascular: Mild cardiomegaly. No pericardial effusion. The thoracic aorta is unremarkable. The origins of the great vessels of the aortic arch appear patent as visualized. There is dilatation of the main pulmonary trunk suggestive of pulmonary hypertension. Evaluation of the pulmonary arteries is very limited due to suboptimal opacification and respiratory motion artifact as well as streak artifact caused by body habitus. No large central pulmonary artery embolus identified. Mediastinum/Nodes: There is no hilar or mediastinal adenopathy. The esophagus and the thyroid gland are grossly unremarkable. No mediastinal fluid collection. Lungs/Pleura: Small right pleural effusion. No focal consolidation, or pneumothorax. The central airways are patent. Upper Abdomen: Fatty liver. Musculoskeletal: No chest wall abnormality. No acute or significant osseous findings. Review of the MIP images confirms the above findings. IMPRESSION: 1. Very limited study for evaluation of pulmonary embolism. No large central pulmonary artery embolus identified. 2. Small right pleural effusion. 3. Mild cardiomegaly. 4. Dilated main pulmonary trunk suggestive of pulmonary hypertension. 5. Fatty liver. Electronically Signed   By: Anner Crete M.D.   On: 08/28/2019 19:49   US Abdomen Complete  Result Date: 08/28/2019 CLINICAL DATA:  Right upper quadrant pain and right flank pain for 1 week. EXAM: ABDOMEN ULTRASOUND COMPLETE COMPARISON:  None. FINDINGS: Gallbladder: No gallstones or wall thickening visualized. No sonographic Murphy sign noted by sonographer. Common bile duct: Diameter: 3 mm, within normal limits. Liver: Diffusely increased and coarsened hepatic echotexture is seen, consistent with diffuse hepatocellular disease. Focal area of fatty sparing is seen centrally, adjacent to the porta hepatis. No liver mass identified. Portal  vein is patent on color Doppler imaging with normal direction of blood flow towards the liver. IVC: No abnormality visualized. Pancreas: Visualized portion unremarkable. Spleen: Size and appearance  within normal limits. Right Kidney: Length: 12.6 cm. Echogenicity within normal limits. No mass or hydronephrosis visualized. Left Kidney: Length: 11.0 cm. Echogenicity within normal limits. No mass or hydronephrosis visualized. Abdominal aorta: No aneurysm visualized. Other findings: None. IMPRESSION: No evidence of cholelithiasis or biliary ductal dilatation. Diffuse hepatocellular disease. No evidence of hydronephrosis. Electronically Signed   By: Marlaine Hind M.D.   On: 08/28/2019 18:52    Procedures Procedures (including critical care time)  Medications Ordered in ED Medications  sodium chloride flush (NS) 0.9 % injection 3 mL (0 mLs Intravenous Hold 08/28/19 1627)  iohexol (OMNIPAQUE) 350 MG/ML injection 100 mL (100 mLs Intravenous Contrast Given 08/28/19 1924)    ED Course  I have reviewed the triage vital signs and the nursing notes.  Pertinent labs & imaging results that were available during my care of the patient were reviewed by me and considered in my medical decision making (see chart for details).    MDM Rules/Calculators/A&P                      Patient presents with right flank pain for the past week.  Discussed possibly musculoskeletal versus gallbladder versus peripheral pulmonary embolism versus other.  Plan for D-dimer as patient is low risk, ultrasound of the gallbladder, blood work.  Blood work ordered and reviewed normal white blood cell count, normal hemoglobin, normal lipase.  Urinalysis pending.  Patient received pain meds urgent care and pain control this time.  Patient has elevated D-dimer however she does have inflammatory condition in her medical history.  CT scan ordered and reviewed and no acute large pulmonary embolism however limited study unable to rule out small  pulmonary embolism.  Patient does have small right pleural effusion which may explain her mild symptoms.  Patient's low risk, low pretest probability.  Plan for close outpatient follow-up for possible VQ scan and ultrasound by primary doctor.  Ultrasound results reviewed and no sign of acute gallbladder pathology.  Fatty liver seen on CT scan.  Blood work reassuring.  Urinalysis reviewed no sign of bleeding or infection.  Final Clinical Impression(s) / ED Diagnoses Final diagnoses:  Acute right flank pain  Fatty liver    Rx / DC Orders ED Discharge Orders    None       Elnora Morrison, MD 08/28/19 2049

## 2019-09-01 ENCOUNTER — Other Ambulatory Visit: Payer: Self-pay | Admitting: Family Medicine

## 2019-09-01 ENCOUNTER — Other Ambulatory Visit (HOSPITAL_COMMUNITY): Payer: Self-pay | Admitting: Family Medicine

## 2019-09-01 DIAGNOSIS — J918 Pleural effusion in other conditions classified elsewhere: Secondary | ICD-10-CM

## 2019-09-01 DIAGNOSIS — I272 Pulmonary hypertension, unspecified: Secondary | ICD-10-CM

## 2019-09-07 ENCOUNTER — Other Ambulatory Visit: Payer: Self-pay

## 2019-09-07 ENCOUNTER — Ambulatory Visit (HOSPITAL_COMMUNITY)
Admission: RE | Admit: 2019-09-07 | Discharge: 2019-09-07 | Disposition: A | Discharge status: Home / Self Care | Payer: PRIVATE HEALTH INSURANCE | Source: Ambulatory Visit | Attending: Family Medicine | Admitting: Family Medicine

## 2019-09-07 DIAGNOSIS — J918 Pleural effusion in other conditions classified elsewhere: Secondary | ICD-10-CM

## 2019-09-07 DIAGNOSIS — I272 Pulmonary hypertension, unspecified: Secondary | ICD-10-CM | POA: Insufficient documentation

## 2019-09-07 MED ORDER — TECHNETIUM TO 99M ALBUMIN AGGREGATED
1.6000 | Freq: Once | INTRAVENOUS | Status: AC | PRN
  Administered 2019-09-07: 1.6 via INTRAVENOUS

## 2019-09-08 ENCOUNTER — Other Ambulatory Visit: Payer: Self-pay | Admitting: Rheumatology

## 2019-09-08 DIAGNOSIS — M0609 Rheumatoid arthritis without rheumatoid factor, multiple sites: Secondary | ICD-10-CM

## 2019-09-08 MED ORDER — HYDROXYCHLOROQUINE SULFATE 200 MG PO TABS: 200.0000 mg | ORAL_TABLET | Freq: Two times a day (BID) | ORAL | 0 refills | Status: DC

## 2019-09-08 NOTE — Telephone Encounter (Signed)
Last Visit: 05/17/19 Next Visit: due December 2020. Message sent to the front to schedule patient  Labs: 08/28/19 Platelets 435, Glucose 130  PLQ Eye Exam: 07/15/19 WNL  Okay to refill per Dr. Estanislado Pandy

## 2019-09-08 NOTE — Telephone Encounter (Signed)
Please schedule patient for a follow up visit. Patient was due December 2020. Thanks!

## 2019-09-08 NOTE — Progress Notes (Deleted)
Office Visit Note  Patient: Kaitlyn Rodgers             Date of Birth: June 29, 1975           MRN: [id number]             PCP: Hayden Rasmussen, MD Referring: Hayden Rasmussen, MD Visit Date: 09/15/2019 Occupation: @GUAROCC @  Subjective:  No chief complaint on file.   History of Present Illness: Symphonie Bohmer is a 44 y.o. female ***   Activities of Daily Living:  Patient reports morning stiffness for *** {minute/hour:19697}.   Patient {ACTIONS;DENIES/REPORTS:21021675::"Denies"} nocturnal pain.  Difficulty dressing/grooming: {ACTIONS;DENIES/REPORTS:21021675::"Denies"} Difficulty climbing stairs: {ACTIONS;DENIES/REPORTS:21021675::"Denies"} Difficulty getting out of chair: {ACTIONS;DENIES/REPORTS:21021675::"Denies"} Difficulty using hands for taps, buttons, cutlery, and/or writing: {ACTIONS;DENIES/REPORTS:21021675::"Denies"}  No Rheumatology ROS completed.   PMFS History:  Patient Active Problem List   Diagnosis Date Noted  . History of diabetes mellitus, type II 05/02/2019  . SIRS (systemic inflammatory response syndrome) ([place]) 10/20/2018  . Acute on chronic respiratory failure with hypoxia ([place]) 10/19/2018  . Type 2 diabetes mellitus without complication ([place]) 99991111  . Rheumatoid arthritis ([place]) 10/19/2018  . Pneumonia due to COVID-19 virus 10/17/2018  . DUB (dysfunctional uterine bleeding) 01/24/2011  . Thickened endometrium 01/24/2011    Past Medical History:  Diagnosis Date  . Abnormal vaginal bleeding   . Anemia   . Asthma    seasonal asthma-using inhaler daily at present  . Complication of anesthesia    panic attack after breast biopsy 20 years ago  . DM II (diabetes mellitus, type II), controlled ([place])    metformin 500mg  qd  . Fibroids   . GERD (gastroesophageal reflux disease)    occasional indigestion-tums prn  . Heart murmur   . Rheumatoid arthritis ([place])     Family History  Problem Relation Age of Onset  . Heart disease Mother   . Diabetes  Maternal Grandmother   . Prostate cancer Father   . Healthy Sister   . Healthy Brother   . Healthy Brother   . Healthy Son   . Diabetes Daughter   . Healthy Daughter   . Healthy Daughter    Past Surgical History:  Procedure Laterality Date  . ABDOMINAL HYSTERECTOMY    . BIOPSY BREAST  age 42   benign   . CYSTOSCOPY  10/13/2011   Procedure: CYSTOSCOPY;  Surgeon: Emily Filbert, MD;  Location: [facility];  Service: Gynecology;  Laterality: N/A;  . TUBAL LIGATION     Social History   Social History Narrative  . Not on file   Immunization History  Administered Date(s) Administered  . Influenza Split 08/22/2011  . Pneumococcal Polysaccharide-23 10/13/2011     Objective: Vital Signs: LMP 07/09/2011    Physical Exam   Musculoskeletal Exam: ***  CDAI Exam: CDAI Score: -- Patient Global: --; Provider Global: -- Swollen: --; Tender: -- Joint Exam 09/15/2019   No joint exam has been documented for this visit   There is currently no information documented on the homunculus. Go to the Rheumatology activity and complete the homunculus joint exam.  Investigation: No additional findings.  Imaging: DG Chest 2 View  Result Date: 09/07/2019 CLINICAL DATA:  Right-sided chest pain 1 week ago, pleural effusion EXAM: CHEST - 2 VIEW COMPARISON:  CT chest, 08/28/2019 FINDINGS: Mild cardiomegaly. No acute appearing airspace opacity. Trace right pleural effusion. The visualized skeletal structures are unremarkable. IMPRESSION: 1.  Mild cardiomegaly. 2.  Trace right pleural effusion as seen on prior examination. 3.  No acute airspace opacity. Electronically Signed   By: Eddie Candle M.D.   On: 09/07/2019 14:23   CT Angio Chest PE W and/or Wo Contrast  Result Date: 08/28/2019 CLINICAL DATA:  44 year old female with right-sided chest pain. EXAM: CT ANGIOGRAPHY CHEST WITH CONTRAST TECHNIQUE: Multidetector CT imaging of the chest was performed using the standard protocol during bolus administration  of intravenous contrast. Multiplanar CT image reconstructions and MIPs were obtained to evaluate the vascular anatomy. CONTRAST:  172mL OMNIPAQUE IOHEXOL 350 MG/ML SOLN COMPARISON:  Chest radiograph dated 10/17/2018. FINDINGS: Evaluation is limited due to streak artifact caused by patient's arms and body habitus. Cardiovascular: Mild cardiomegaly. No pericardial effusion. The thoracic aorta is unremarkable. The origins of the great vessels of the aortic arch appear patent as visualized. There is dilatation of the main pulmonary trunk suggestive of pulmonary hypertension. Evaluation of the pulmonary arteries is very limited due to suboptimal opacification and respiratory motion artifact as well as streak artifact caused by body habitus. No large central pulmonary artery embolus identified. Mediastinum/Nodes: There is no hilar or mediastinal adenopathy. The esophagus and the thyroid gland are grossly unremarkable. No mediastinal fluid collection. Lungs/Pleura: Small right pleural effusion. No focal consolidation, or pneumothorax. The central airways are patent. Upper Abdomen: Fatty liver. Musculoskeletal: No chest wall abnormality. No acute or significant osseous findings. Review of the MIP images confirms the above findings. IMPRESSION: 1. Very limited study for evaluation of pulmonary embolism. No large central pulmonary artery embolus identified. 2. Small right pleural effusion. 3. Mild cardiomegaly. 4. Dilated main pulmonary trunk suggestive of pulmonary hypertension. 5. Fatty liver. Electronically Signed   By: Anner Crete M.D.   On: 08/28/2019 19:49   US Abdomen Complete  Result Date: 08/28/2019 CLINICAL DATA:  Right upper quadrant pain and right flank pain for 1 week. EXAM: ABDOMEN ULTRASOUND COMPLETE COMPARISON:  None. FINDINGS: Gallbladder: No gallstones or wall thickening visualized. No sonographic Murphy sign noted by sonographer. Common bile duct: Diameter: 3 mm, within normal limits. Liver:  Diffusely increased and coarsened hepatic echotexture is seen, consistent with diffuse hepatocellular disease. Focal area of fatty sparing is seen centrally, adjacent to the porta hepatis. No liver mass identified. Portal vein is patent on color Doppler imaging with normal direction of blood flow towards the liver. IVC: No abnormality visualized. Pancreas: Visualized portion unremarkable. Spleen: Size and appearance within normal limits. Right Kidney: Length: 12.6 cm. Echogenicity within normal limits. No mass or hydronephrosis visualized. Left Kidney: Length: 11.0 cm. Echogenicity within normal limits. No mass or hydronephrosis visualized. Abdominal aorta: No aneurysm visualized. Other findings: None. IMPRESSION: No evidence of cholelithiasis or biliary ductal dilatation. Diffuse hepatocellular disease. No evidence of hydronephrosis. Electronically Signed   By: Marlaine Hind M.D.   On: 08/28/2019 18:52   NM Pulmonary Perf and Vent  Result Date: 09/07/2019 CLINICAL DATA:  Elevated D-dimer, right pleural effusion, rule out pulmonary embolism EXAM: NUCLEAR MEDICINE PERFUSION LUNG SCAN TECHNIQUE: Perfusion images were obtained in multiple projections after intravenous injection of radiopharmaceutical. Ventilation scans intentionally deferred if perfusion scan and chest x-ray adequate for interpretation during COVID 19 epidemic. RADIOPHARMACEUTICALS:  1.6 mCi Tc-65m MAA IV COMPARISON:  Same day chest radiographs FINDINGS: Minimal right costophrenic blunting, in keeping with known trace pleural effusion. No other perfusion defect. Cardiomegaly. IMPRESSION: Very low probability examination for pulmonary embolism by modified perfusion only PIOPED criteria (PE absent). Electronically Signed   By: Eddie Candle M.D.   On: 09/07/2019 14:25    Recent Labs: Lab Results  Component Value Date   WBC 8.1 08/28/2019   HGB 12.4 08/28/2019   PLT 435 (H) 08/28/2019   NA 139 08/28/2019   K 4.5 08/28/2019   CL 101 08/28/2019    CO2 29 08/28/2019   GLUCOSE 130 (H) 08/28/2019   BUN 14 08/28/2019   CREATININE 0.58 08/28/2019   BILITOT 0.6 08/28/2019   ALKPHOS 59 08/28/2019   AST 19 08/28/2019   ALT 21 08/28/2019   PROT 7.7 08/28/2019   ALBUMIN 3.5 08/28/2019   CALCIUM 9.7 08/28/2019   GFRAA >60 08/28/2019    Speciality Comments: PLQ Eye Exam: 07/15/19 WNL with Dr Delman Cheadle follow up in 1 year   Procedures:  No procedures performed Allergies: Patient has no known allergies.   Assessment / Plan:     Visit Diagnoses: No diagnosis found.  Orders: No orders of the defined types were placed in this encounter.  No orders of the defined types were placed in this encounter.   Face-to-face time spent with patient was *** minutes. Greater than 50% of time was spent in counseling and coordination of care.  Follow-Up Instructions: No follow-ups on file.   Earnestine Mealing, CMA  Note - This record has been created using Editor, commissioning.  Chart creation errors have been sought, but may not always  have been located. Such creation errors do not reflect on  the standard of medical care.

## 2019-09-13 ENCOUNTER — Other Ambulatory Visit: Payer: Self-pay

## 2019-09-13 ENCOUNTER — Ambulatory Visit: Payer: PRIVATE HEALTH INSURANCE | Admitting: Internal Medicine

## 2019-09-13 ENCOUNTER — Encounter: Payer: Self-pay | Admitting: Internal Medicine

## 2019-09-13 VITALS — BP 120/75 | HR 84 | Temp 97.6°F | Ht 64.0 in | Wt 334.8 lb

## 2019-09-13 DIAGNOSIS — R0602 Shortness of breath: Secondary | ICD-10-CM | POA: Diagnosis not present

## 2019-09-13 DIAGNOSIS — J4540 Moderate persistent asthma, uncomplicated: Secondary | ICD-10-CM | POA: Diagnosis not present

## 2019-09-13 DIAGNOSIS — K219 Gastro-esophageal reflux disease without esophagitis: Secondary | ICD-10-CM

## 2019-09-13 DIAGNOSIS — I288 Other diseases of pulmonary vessels: Secondary | ICD-10-CM | POA: Diagnosis not present

## 2019-09-13 MED ORDER — DULERA 100-5 MCG/ACT IN AERO: 2.0000 | INHALATION_SPRAY | Freq: Two times a day (BID) | RESPIRATORY_TRACT | 5 refills | Status: AC

## 2019-09-13 MED ORDER — PANTOPRAZOLE SODIUM 40 MG PO TBEC: 40.0000 mg | DELAYED_RELEASE_TABLET | Freq: Every day | ORAL | 5 refills | Status: AC

## 2019-09-13 MED ORDER — ALBUTEROL SULFATE HFA 108 (90 BASE) MCG/ACT IN AERS: 2.0000 | INHALATION_SPRAY | Freq: Four times a day (QID) | RESPIRATORY_TRACT | 5 refills | Status: DC | PRN

## 2019-09-13 NOTE — Patient Instructions (Addendum)
The patient should have follow up scheduled with myself in 2 months.   Prior to next visit patient should have: Spirometry/Feno  Start taking Dulera and albuterol again for asthma.  Schedule your echocardiogram  ----------------------------------------------------------------------------------------------------------------- Metered Dose Inhaler (MDI) Instructions (Albuterol, Dulera)  Before using your inhaler for the first time: 1. Take the cap off the mouthpiece. 2. Shake the inhaler for 5 seconds 3. Press down on the canister to spray the medicine into the air. 4. Repeat these steps 3 more times. If you haven't used your inhaler in more than 2 weeks, repeat these steps before using it.  To use your inhaler: 1. Take the cap off the mouthpiece 2. Shake the inhaler for 5 seconds. 3. Hold it upright with your finger on the top of the canister and your thumb on the bottom of the inhaler. 4. Breathe out. 5. Close your lips around the mouthpiece. 6. As you start to inhale the next breath, press down on the canister. 7. Inhale deeply and slowly through your mouth. 8. Hold your breath for 5 to 10 seconds to keep the medicine in your lungs. 9. Let your breath out.   10. Repeat these steps if you are supposed to take 2 puffs.  11. Put the cap back on the mouthpiece 12. Remember to rinse, gargle and spit with water after use if your inhaler has a steroid in it (Advair, Symbicort, Dulera, Qvar, Flovent)  Caring for your MDI and chamber For most MDIs, remove the canister and rinse the plastic holder with warm running water once a week to prevent the holes from getting clogged. Shake well and let air dry. There are some medications in which the inhaler cannot be removed from the holder. These usually need to be cleaned by wiping the mouthpiece with a cloth or cleaning with a dry cotton swab. Refer to the patient instructions that come with your inhaler. Clean the chamber about once a week.  Remove the soft ring at the end of the chamber. Soak the spacer in warm water with a mild detergent. Carefully clean and, rinse, and shake off excess water. Do not hand dry. Allow to completely air dry. Do not store the chamber in a plastic bag.  Checking your MDI It is important that you know how much medication is left in your inhaler. The number of puffs contained in your MDI is printed on the side of the canister. After you have used that number of puffs, you must discard your inhaler even if it continues to spray. Keep track of how many puffs you have used. You also must include priming puffs in this total. If you use an MDI every day for control of symptoms, you can determine how long it will last by dividing the total number of puffs in the MDI by the total puffs you use every day. For example: 2 puffs x 2 times per day = 4 total puffs per day. At 120 puffs, the MDI will last 30 days. If you use an inhaler only when you need to, you must keep track of how many times you spray the inhaler. Some of the newer MDIs have counting devices built in.  If your MDI does not have a dose counter, you can obtain a device that attaches to the MDI and counts down the number of puffs each time you press the inhaler. Ask your health care professional for more information about these devices, as well as how to best keep track of your  medicine without an add-on device (if you prefer).

## 2019-09-13 NOTE — Progress Notes (Signed)
Kaitlyn Rodgers    [id number]    08-Aug-1975  Primary Care Physician:Richter, Maebelle Munroe, MD  Referring Physician: Hayden Rasmussen, MD [place],  [place] [address] Reason for Consultation: shortness of breath after covid Date of Consultation: 09/13/2019  Chief complaint:   Chief Complaint  Patient presents with  . Consult    sob with exertion since covid positive 4/26-4/29 hospitalized.  when she lies flat feels movement in chest.     HPI: Kaitlyn Rodgers is a 44 y.o. with a history of RA on plaquenil who was covid positive in April 2020. She was hospitalized and treated with oxygen and fluid restriction. Discharged on [facility] She is now off this.   Started having pleuritic chest pain about 3 weeks ago. Had CTPE study which showed no PE, but suggested pulmonary hypertension and a small right sided pleural effusion. Had low probability V/Q scan. Here for follow up.   She feels a gurgling on the right side of her chest when she is laying down occasionally. No pain.  She does have wheezing when she is up walking around.   She does have asthma and has been on symbicort in the past. She stopped taking this recently since symptoms  She does have seasonal allergies, post nasal drip and this is also making breathing worse.   Other than covid hospitalization no other respiratory hospitalizations  Triggers for asthma - seasonal allergies.   No issues with sleep. Had a home sleep study 5-6 years ago but never found out the results.   OBSTRUCTIVE SLEEP APNEA SCREENING  1.  Snoring?:  Yes 2.  Tired?:  Yes, but denies excessive daytime sleepiness 3.  Observed apnea, stop breathing or choking/gasping during sleep?:  no 4.  Pressure. HTN history?  no 5.  BMI more than 35 kg/m2?  yes 6.  Age more than 85 yrs?  no 7.  Neck size larger than 17 in for female or 16 in for female?  yes 8.  Gender = Female?  no  Total:  3  For general population  OSA - Low Risk :  Yes to 0 - 2 questions OSA - Intermediate Risk : Yes to 3 - 4 questions OSA - High Risk : Yes to 5 - 8 questions  or Yes to 2 or more of 4 STOP questions + female gender or Yes to 2 or more of 4 STOP questions + BMI > 35kg/m2  or Yes to 2 or more of 4 STOP questions + neck circumference 17 inches / 43cm in female or 16 inches / 41cm in female  References: Rinaldo Cloud al. Anesthesiology 2008; 108: 812-821,  Gabriel Cirri et al Br Dara Hoyer 2012; 108: [id number],  Gabriel Cirri et al J Clin Sleep Med Sept 2014.   Social history:  Occupation: Works at [employer].  Exposures: Lives at home with husband and kids.  Smoking history: never smoker, passive smoke exposure in childhood.  No ongoing.   Social History   Occupational History  . Not on file  Tobacco Use  . Smoking status: Never Smoker  . Smokeless tobacco: Never Used  Substance and Sexual Activity  . Alcohol use: No  . Drug use: No  . Sexual activity: Yes    Birth control/protection: Surgical    Relevant family history: Family History  Problem Relation Age of Onset  . Heart disease Mother   . Diabetes Maternal Grandmother   .  Prostate cancer Father   . Healthy Sister   . Healthy Brother   . Healthy Brother   . Healthy Son   . Diabetes Daughter   . Healthy Daughter   . Asthma Daughter   . Healthy Daughter     Past Medical History:  Diagnosis Date  . Abnormal vaginal bleeding   . Anemia   . Asthma    seasonal asthma-using inhaler daily at present  . Complication of anesthesia    panic attack after breast biopsy 20 years ago  . DM II (diabetes mellitus, type II), controlled ([facility])    metformin 500mg  qd  . Fibroids   . GERD (gastroesophageal reflux disease)    occasional indigestion-tums prn  . Heart murmur   . Rheumatoid arthritis [facility])     Past Surgical History:  Procedure Laterality Date  . ABDOMINAL HYSTERECTOMY    . BIOPSY BREAST  age 24   benign   . CYSTOSCOPY  10/13/2011   Procedure: CYSTOSCOPY;  Surgeon:  Emily Filbert, MD;  Location: [facility];  Service: Gynecology;  Laterality: N/A;  . TUBAL LIGATION       Review of systems: Review of Systems  Constitutional: Negative for chills, fever and weight loss.  HENT: Negative for congestion, sinus pain and sore throat.   Eyes: Negative for discharge and redness.  Respiratory: Positive for cough and wheezing. Negative for hemoptysis, sputum production and shortness of breath.   Cardiovascular: Positive for leg swelling. Negative for chest pain and palpitations.  Gastrointestinal: Negative for heartburn, nausea and vomiting.  Musculoskeletal: Positive for joint pain. Negative for myalgias.  Skin: Negative for rash.  Neurological: Negative for dizziness, tremors, focal weakness and headaches.  Endo/Heme/Allergies: Positive for environmental allergies.  Psychiatric/Behavioral: Negative for depression. The patient is not nervous/anxious.   All other systems reviewed and are negative.   Physical Exam: Blood pressure 120/75, pulse 84, temperature 97.6 F (36.4 C), temperature source Temporal, height 5\' 4"  (1.626 m), weight (!) 334 lb 12.8 oz (151.9 kg), last menstrual period 07/09/2011, SpO2 98 %. Gen:      No acute distress ENT:  no nasal polyps, mucus membranes moist, mallampati 4 Lungs:    No increased respiratory effort, symmetric chest wall excursion, clear to auscultation bilaterally, no wheezes or crackles CV:         Regular rate and rhythm; no murmurs, rubs, or gallops.  Trace left pedal edema Abd:      Obese, soft, no distension MSK: no acute synovitis of DIP or PIP joints, no mechanics hands.  Skin:      Warm and dry; no rashes Neuro: normal speech, no focal facial asymmetry Psych: alert and oriented x3, normal mood and affect   Data Reviewed/Medical Decision Making:  Independent interpretation of tests: Imaging: . Review of patient's CT Angio chestimages revealed enlarged pulmonary artery, trace left sided pleural effusion, patulous  esophagus. The patient's images have been independently reviewed by me.    PFTs: None on file  V/Q Scan low probability in March 2020  Labs:  Lab Results  Component Value Date   WBC 8.1 08/28/2019   HGB 12.4 08/28/2019   HCT 40.7 08/28/2019   MCV 89.6 08/28/2019   PLT 435 (H) 08/28/2019   Lab Results  Component Value Date   NA 139 08/28/2019   K 4.5 08/28/2019   CL 101 08/28/2019   CO2 29 08/28/2019     Immunization status:  Immunization History  Administered Date(s) Administered  . Influenza Split  08/22/2011  . Influenza,inj,Quad PF,6+ Mos 03/24/2019  . PFIZER SARS-COV-2 Vaccination 08/17/2019  . Pneumococcal Polysaccharide-23 10/13/2011    . I reviewed prior external note(s) from ED visit. Primary care . I reviewed the result(s) of the labs and imaging as noted above.  . 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 Covid 19 infection April 2020  Plan/Recommendations: Resume ICS-LABA for asthma - dulera HFA BID with prn albuterol Will obtain spirometry and FENO Start PPI for GERD Possible pulmonary hypertension - will start with screening echocardiogram.  She will obtain results from her PSG done a few years ago - see if we need to repeat.   Return to Care: Return in about 8 weeks (around 11/08/2019).  Lenice Llamas, MD Pulmonary and [place]  CC: Hayden Rasmussen, MD

## 2019-09-15 ENCOUNTER — Ambulatory Visit: Payer: PRIVATE HEALTH INSURANCE | Admitting: Rheumatology

## 2019-09-15 NOTE — Progress Notes (Signed)
Office Visit Note  Patient: Kaitlyn Rodgers             Date of Birth: [birth year]           MRN: [id number]             PCP: Hayden Rasmussen, MD Referring: Hayden Rasmussen, MD Visit Date: 09/19/2019 Occupation: @GUAROCC @  Subjective:  Pain in multiple joints   History of Present Illness: Leza Ashdown is a 44 y.o. female with history of seronegative rheumatoid arthritis.  She is taking Plaquenil 200 mg 1 tablet by mouth twice daily.  She has not missed any doses of Plaquenil recently.  She continues to experience diarrhea but she is unsure if it is related to Metformin use.  She states that she has not noticed much improvement since starting on Plaquenil.  She continues to have discomfort in both shoulder joints,, right hand, and left ankle joint. She states her left ankle joint remains swollen from a previous injury. She denies any other joint swelling.  She has been experiencing trapezius muscle tension and muscle tenderness bilaterally.  She is also had intermittent lower back pain.   Activities of Daily Living:  Patient reports morning stiffness for 10 minutes.   Patient Reports nocturnal pain.  Difficulty dressing/grooming: Denies Difficulty climbing stairs: Reports Difficulty getting out of chair: Denies Difficulty using hands for taps, buttons, cutlery, and/or writing: Reports  Review of Systems  Constitutional: Positive for fatigue.  HENT: Positive for mouth dryness.   Eyes: Negative for dryness.  Respiratory: Positive for shortness of breath.   Cardiovascular: Negative for swelling in legs/feet.  Gastrointestinal: Positive for diarrhea.  Endocrine: Positive for increased urination.  Genitourinary: Negative for painful urination.  Musculoskeletal: Positive for arthralgias, gait problem, joint pain, joint swelling, muscle weakness, morning stiffness and muscle tenderness.  Skin: Negative for rash.  Allergic/Immunologic: Positive for susceptible to infections.    Neurological: Positive for weakness.  Hematological: Negative for bruising/bleeding tendency.  Psychiatric/Behavioral: Negative for sleep disturbance.    PMFS History:  Patient Active Problem List   Diagnosis Date Noted   History of diabetes mellitus, type II 05/02/2019   SIRS (systemic inflammatory response syndrome) ([place]) 10/20/2018   Acute on chronic respiratory failure with hypoxia ([place]) 10/19/2018   Type 2 diabetes mellitus without complication ([place]) 99991111   Rheumatoid arthritis ([place]) 10/19/2018   Pneumonia due to COVID-19 virus 10/17/2018   DUB (dysfunctional uterine bleeding) 01/24/2011   Thickened endometrium 01/24/2011    Past Medical History:  Diagnosis Date   Abnormal vaginal bleeding    Anemia    Asthma    seasonal asthma-using inhaler daily at present   Complication of anesthesia    panic attack after breast biopsy 20 years ago   DM II (diabetes mellitus, type II), controlled ([facility])    metformin 500mg  qd   Fibroids    GERD (gastroesophageal reflux disease)    occasional indigestion-tums prn   Heart murmur    Rheumatoid arthritis ([place])     Family History  Problem Relation Age of Onset   Heart disease Mother    Diabetes Maternal Grandmother    Prostate cancer Father    Healthy Sister    Healthy Brother    Healthy Brother    Healthy Son    Diabetes Daughter    Healthy Daughter    Asthma Daughter    Healthy Daughter    Past Surgical History:  Procedure Laterality Date   ABDOMINAL HYSTERECTOMY  BIOPSY BREAST  age 63   benign    CYSTOSCOPY  10/13/2011   Procedure: CYSTOSCOPY;  Surgeon: Emily Filbert, MD;  Location: [facility];  Service: Gynecology;  Laterality: N/A;   TUBAL LIGATION     Social History   Social History Narrative   Not on file   Immunization History  Administered Date(s) Administered   Influenza Split 08/22/2011   Influenza,inj,Quad PF,6+ Mos 03/24/2019   PFIZER SARS-COV-2 Vaccination  08/17/2019   Pneumococcal Polysaccharide-23 10/13/2011     Objective: Vital Signs: BP 103/66 (BP Location: Right Arm, Patient Position: Sitting, Cuff Size: Large)    Pulse 87    Resp 18    Ht 5\' 4"  (1.626 m)    Wt (!) 334 lb 9.6 oz (151.8 kg)    LMP 07/09/2011    BMI 57.43 kg/m    Physical Exam Vitals and nursing note reviewed.  Constitutional:      Appearance: She is well-developed.  HENT:     Head: Normocephalic and atraumatic.  Eyes:     Conjunctiva/sclera: Conjunctivae normal.  Pulmonary:     Effort: Pulmonary effort is normal.  Abdominal:     General: Bowel sounds are normal.     Palpations: Abdomen is soft.  Musculoskeletal:     Cervical back: Normal range of motion.  Lymphadenopathy:     Cervical: No cervical adenopathy.  Skin:    General: Skin is warm and dry.     Capillary Refill: Capillary refill takes less than 2 seconds.  Neurological:     Mental Status: She is alert and oriented to person, place, and time.  Psychiatric:        Behavior: Behavior normal.      Musculoskeletal Exam: C-spine, thoracic spine, lumbar spine good range of motion.  No midline spinal tenderness.  She has tenderness over bilateral SI joints.  Trapezius muscle tension and muscle tenderness bilaterally.  Shoulder joints have good range of motion with some discomfort in the right shoulder with internal rotation.  Elbow joints have good range of motion with no tenderness or inflammation.  Wrist joints have good range of motion with no tenderness or synovitis.  She has tenderness of the right fourth and fifth MCPs and left third MCP.  She has complete fist formation bilaterally.  Hip joints have good range of motion.  Knee joints have good range of motion with no warmth or effusion.  Left ankle joint swelling but no warmth or tenderness.  Right ankle joint good ROM with no tenderness or inflammation.   CDAI Exam: CDAI Score: 4.8  Patient Global: 5 mm; Provider Global: 3 mm Swollen: 0 ; Tender: 4   Joint Exam 09/19/2019      Right  Left  Glenohumeral   Tender     MCP 3      Tender  MCP 4   Tender     MCP 5   Tender        Investigation: No additional findings.  Imaging: DG Chest 2 View  Result Date: 09/07/2019 CLINICAL DATA:  Right-sided chest pain 1 week ago, pleural effusion EXAM: CHEST - 2 VIEW COMPARISON:  CT chest, 08/28/2019 FINDINGS: Mild cardiomegaly. No acute appearing airspace opacity. Trace right pleural effusion. The visualized skeletal structures are unremarkable. IMPRESSION: 1.  Mild cardiomegaly. 2.  Trace right pleural effusion as seen on prior examination. 3.  No acute airspace opacity. Electronically Signed   By: Eddie Candle M.D.   On: 09/07/2019 14:23   CT  Angio Chest PE W and/or Wo Contrast  Result Date: 08/28/2019 CLINICAL DATA:  44 year old female with right-sided chest pain. EXAM: CT ANGIOGRAPHY CHEST WITH CONTRAST TECHNIQUE: Multidetector CT imaging of the chest was performed using the standard protocol during bolus administration of intravenous contrast. Multiplanar CT image reconstructions and MIPs were obtained to evaluate the vascular anatomy. CONTRAST:  136mL OMNIPAQUE IOHEXOL 350 MG/ML SOLN COMPARISON:  Chest radiograph dated 10/17/2018. FINDINGS: Evaluation is limited due to streak artifact caused by patient's arms and body habitus. Cardiovascular: Mild cardiomegaly. No pericardial effusion. The thoracic aorta is unremarkable. The origins of the great vessels of the aortic arch appear patent as visualized. There is dilatation of the main pulmonary trunk suggestive of pulmonary hypertension. Evaluation of the pulmonary arteries is very limited due to suboptimal opacification and respiratory motion artifact as well as streak artifact caused by body habitus. No large central pulmonary artery embolus identified. Mediastinum/Nodes: There is no hilar or mediastinal adenopathy. The esophagus and the thyroid gland are grossly unremarkable. No mediastinal fluid  collection. Lungs/Pleura: Small right pleural effusion. No focal consolidation, or pneumothorax. The central airways are patent. Upper Abdomen: Fatty liver. Musculoskeletal: No chest wall abnormality. No acute or significant osseous findings. Review of the MIP images confirms the above findings. IMPRESSION: 1. Very limited study for evaluation of pulmonary embolism. No large central pulmonary artery embolus identified. 2. Small right pleural effusion. 3. Mild cardiomegaly. 4. Dilated main pulmonary trunk suggestive of pulmonary hypertension. 5. Fatty liver. Electronically Signed   By: Anner Crete M.D.   On: 08/28/2019 19:49   US Abdomen Complete  Result Date: 08/28/2019 CLINICAL DATA:  Right upper quadrant pain and right flank pain for 1 week. EXAM: ABDOMEN ULTRASOUND COMPLETE COMPARISON:  None. FINDINGS: Gallbladder: No gallstones or wall thickening visualized. No sonographic Murphy sign noted by sonographer. Common bile duct: Diameter: 3 mm, within normal limits. Liver: Diffusely increased and coarsened hepatic echotexture is seen, consistent with diffuse hepatocellular disease. Focal area of fatty sparing is seen centrally, adjacent to the porta hepatis. No liver mass identified. Portal vein is patent on color Doppler imaging with normal direction of blood flow towards the liver. IVC: No abnormality visualized. Pancreas: Visualized portion unremarkable. Spleen: Size and appearance within normal limits. Right Kidney: Length: 12.6 cm. Echogenicity within normal limits. No mass or hydronephrosis visualized. Left Kidney: Length: 11.0 cm. Echogenicity within normal limits. No mass or hydronephrosis visualized. Abdominal aorta: No aneurysm visualized. Other findings: None. IMPRESSION: No evidence of cholelithiasis or biliary ductal dilatation. Diffuse hepatocellular disease. No evidence of hydronephrosis. Electronically Signed   By: Marlaine Hind M.D.   On: 08/28/2019 18:52   NM Pulmonary Perf and  Vent  Result Date: 09/07/2019 CLINICAL DATA:  Elevated D-dimer, right pleural effusion, rule out pulmonary embolism EXAM: NUCLEAR MEDICINE PERFUSION LUNG SCAN TECHNIQUE: Perfusion images were obtained in multiple projections after intravenous injection of radiopharmaceutical. Ventilation scans intentionally deferred if perfusion scan and chest x-ray adequate for interpretation during COVID 19 epidemic. RADIOPHARMACEUTICALS:  1.6 mCi Tc-45m MAA IV COMPARISON:  Same day chest radiographs FINDINGS: Minimal right costophrenic blunting, in keeping with known trace pleural effusion. No other perfusion defect. Cardiomegaly. IMPRESSION: Very low probability examination for pulmonary embolism by modified perfusion only PIOPED criteria (PE absent). Electronically Signed   By: Eddie Candle M.D.   On: 09/07/2019 14:25    Recent Labs: Lab Results  Component Value Date   WBC 8.1 08/28/2019   HGB 12.4 08/28/2019   PLT 435 (H) 08/28/2019  NA 139 08/28/2019   K 4.5 08/28/2019   CL 101 08/28/2019   CO2 29 08/28/2019   GLUCOSE 130 (H) 08/28/2019   BUN 14 08/28/2019   CREATININE 0.58 08/28/2019   BILITOT 0.6 08/28/2019   ALKPHOS 59 08/28/2019   AST 19 08/28/2019   ALT 21 08/28/2019   PROT 7.7 08/28/2019   ALBUMIN 3.5 08/28/2019   CALCIUM 9.7 08/28/2019   GFRAA >60 08/28/2019    Speciality Comments: PLQ Eye Exam: 07/15/19 WNL with Dr Delman Cheadle follow up in 1 year   Procedures:  No procedures performed Allergies: Patient has no known allergies.   Assessment / Plan:     Visit Diagnoses: Rheumatoid arthritis of multiple sites with negative rheumatoid factor ([place]): She has no synovitis on exam today.  She has tenderness of the right fourth and fifth MCPs and left third MCP joint.  She has complete fist formation bilaterally.  She has been experiencing intermittent discomfort in the right shoulder joint which was tender and had painful internal rotation.  She has been taking Plaquenil 200 mg 1 tablet by mouth  twice daily since December 2020.  She has been experiencing diarrhea on a daily basis but attributes it to taking Metformin.  She has not developed any other side effects.  She has not missed any doses of Plaquenil.  According to the patient she has not noticed much clinical improvement since starting on Plaquenil.  We discussed checking a sed rate and scheduling an ultrasound of both hands to assess for synovitis.  We discussed that if she does have active inflammation she will require more aggressive treatment.  Methotrexate could be a treatment option in the future.  She will continue taking Plaquenil as prescribed.  She was advised to notify us if she develops increased joint pain or joint swelling.  She will follow-up in the office in 6 weeks.- Plan: Sedimentation rate  High risk medication use - PLQ 200 mg 1 tablet by mouth twice daily.  CBC and CMP were stable on 08/28/2019.  PLQ Eye Exam: 07/15/19 WNL with Dr Delman Cheadle follow up in 1 year.    Chronic pain of both shoulders: X-rays of the left shoulder were unremarkable on 05/11/2019.  X-rays of the right shoulder revealed calcification next to acromium but otherwise were unremarkable.  She has good range of motion in both shoulder joints.  She has discomfort with internal rotation of the right shoulder.  She experiences intermittent nocturnal pain in the right shoulder joint.  Chronic pain of both hips: She has good ROM of both hip joints.  X-rays of both hip joints were obtained on 05/11/2019 which were unremarkable.  Tenderness over bilateral trochanteric bursa.    Pain in both hands:   Primary osteoarthritis of both feet: She has good ROM of both ankle joints.  Swelling of the left ankle joint but no warmth or tenderness noted.  No tenderness of MTP joints.  She wears proper fitting shoes.   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   Pes planus of both feet: She is not having any discomfort at this time.   Type 2 diabetes  mellitus without complication, with long-term current use of insulin ([place]): She is on trulicity and metformin.   Pneumonia due to COVID-19 virus: Resolved   Family history of systemic lupus erythematosus  Pleuritic chest pain: According to the patient she started experiencing pleuritic chest pain at the beginning of March 2021.  She was evaluated in the ED on 08/28/19  and underwent a thorough workup. She had a CT PE study that was negative for pulmonary embolism but did suggest findings consistent with pulmonary hypertension and a trace right-sided pleural effusion.  She continues to experience a gurgling sensation on the right side of her chest especially when lying down for prolonged periods of time.  She established care with Dr. Shearon Stalls on 09/13/2019.  She will be undergoing spirometry and an echocardiogram for further evaluation.  She was also started on a trial of Protonix 40 mg 1 tablet daily.  Orders: Orders Placed This Encounter  Procedures   Sedimentation rate   No orders of the defined types were placed in this encounter.   Face-to-face time spent with patient was 30 minutes. Greater than 50% of time was spent in counseling and coordination of care.  Follow-Up Instructions: Return in about 6 weeks (around 10/31/2019) for Rheumatoid arthritis.   Ofilia Neas, PA-C   I examined and evaluated the patient with Hazel Sams PA.  Patient continues to have some arthralgias and tender nodes.  No synovitis was noted on examination.  Will obtain sedimentation rate today.  We will also schedule ultrasound of her bilateral hands to look for synovitis.  I had brief discussion with her regarding methotrexate but she declined.  The plan of care was discussed as noted above.  Bo Merino, MD  Note - This record has been created using Editor, commissioning.  Chart creation errors have been sought, but may not always  have been located. Such creation errors do not reflect on  the standard of medical  care.

## 2019-09-19 ENCOUNTER — Encounter: Payer: Self-pay | Admitting: Rheumatology

## 2019-09-19 ENCOUNTER — Ambulatory Visit (INDEPENDENT_AMBULATORY_CARE_PROVIDER_SITE_OTHER): Payer: PRIVATE HEALTH INSURANCE | Admitting: Rheumatology

## 2019-09-19 ENCOUNTER — Other Ambulatory Visit: Payer: Self-pay

## 2019-09-19 VITALS — BP 103/66 | HR 87 | Resp 18 | Ht 64.0 in | Wt 334.6 lb

## 2019-09-19 DIAGNOSIS — M25511 Pain in right shoulder: Secondary | ICD-10-CM | POA: Diagnosis not present

## 2019-09-19 DIAGNOSIS — G8929 Other chronic pain: Secondary | ICD-10-CM

## 2019-09-19 DIAGNOSIS — R0781 Pleurodynia: Secondary | ICD-10-CM

## 2019-09-19 DIAGNOSIS — M25551 Pain in right hip: Secondary | ICD-10-CM | POA: Diagnosis not present

## 2019-09-19 DIAGNOSIS — M0609 Rheumatoid arthritis without rheumatoid factor, multiple sites: Secondary | ICD-10-CM

## 2019-09-19 DIAGNOSIS — M79642 Pain in left hand: Secondary | ICD-10-CM

## 2019-09-19 DIAGNOSIS — J1282 Pneumonia due to coronavirus disease 2019: Secondary | ICD-10-CM

## 2019-09-19 DIAGNOSIS — Z794 Long term (current) use of insulin: Secondary | ICD-10-CM

## 2019-09-19 DIAGNOSIS — M722 Plantar fascial fibromatosis: Secondary | ICD-10-CM

## 2019-09-19 DIAGNOSIS — M25552 Pain in left hip: Secondary | ICD-10-CM

## 2019-09-19 DIAGNOSIS — E119 Type 2 diabetes mellitus without complications: Secondary | ICD-10-CM

## 2019-09-19 DIAGNOSIS — M19072 Primary osteoarthritis, left ankle and foot: Secondary | ICD-10-CM

## 2019-09-19 DIAGNOSIS — U071 COVID-19: Secondary | ICD-10-CM

## 2019-09-19 DIAGNOSIS — M2142 Flat foot [pes planus] (acquired), left foot: Secondary | ICD-10-CM

## 2019-09-19 DIAGNOSIS — M2141 Flat foot [pes planus] (acquired), right foot: Secondary | ICD-10-CM

## 2019-09-19 DIAGNOSIS — M79641 Pain in right hand: Secondary | ICD-10-CM

## 2019-09-19 DIAGNOSIS — Z8269 Family history of other diseases of the musculoskeletal system and connective tissue: Secondary | ICD-10-CM

## 2019-09-19 DIAGNOSIS — Z79899 Other long term (current) drug therapy: Secondary | ICD-10-CM

## 2019-09-19 DIAGNOSIS — M25512 Pain in left shoulder: Secondary | ICD-10-CM

## 2019-09-19 DIAGNOSIS — M19071 Primary osteoarthritis, right ankle and foot: Secondary | ICD-10-CM

## 2019-09-19 LAB — SEDIMENTATION RATE: Sed Rate: 45 mm/h — ABNORMAL HIGH (ref 0–20)

## 2019-09-20 NOTE — Progress Notes (Signed)
ESR is elevated but stable. Please check if the patient scheduled an ultrasound of both hands.

## 2019-09-22 ENCOUNTER — Ambulatory Visit: Payer: PRIVATE HEALTH INSURANCE | Admitting: Internal Medicine

## 2019-09-22 ENCOUNTER — Other Ambulatory Visit: Payer: Self-pay

## 2019-09-22 DIAGNOSIS — J4540 Moderate persistent asthma, uncomplicated: Secondary | ICD-10-CM

## 2019-09-22 DIAGNOSIS — R0602 Shortness of breath: Secondary | ICD-10-CM

## 2019-09-28 ENCOUNTER — Ambulatory Visit: Payer: Self-pay

## 2019-09-28 ENCOUNTER — Other Ambulatory Visit: Payer: Self-pay

## 2019-09-28 ENCOUNTER — Ambulatory Visit (INDEPENDENT_AMBULATORY_CARE_PROVIDER_SITE_OTHER): Payer: PRIVATE HEALTH INSURANCE | Admitting: Rheumatology

## 2019-09-28 DIAGNOSIS — M79641 Pain in right hand: Secondary | ICD-10-CM | POA: Diagnosis not present

## 2019-09-28 DIAGNOSIS — M79642 Pain in left hand: Secondary | ICD-10-CM | POA: Diagnosis not present

## 2019-09-28 NOTE — Progress Notes (Signed)
Ultrasound examination of bilateral hands performed today did not show any synovitis.  Patient has been working out which could be contributing to some of the discomfort in her hands.  She also complains of discomfort in her right hand at nighttime.  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  I offered nerve conduction velocity but she declined.  She has been using a carpal tunnel brace at nighttime. Bo Merino, MD

## 2019-10-03 ENCOUNTER — Other Ambulatory Visit: Payer: Self-pay

## 2019-10-03 ENCOUNTER — Ambulatory Visit (INDEPENDENT_AMBULATORY_CARE_PROVIDER_SITE_OTHER): Payer: PRIVATE HEALTH INSURANCE | Admitting: Internal Medicine

## 2019-10-03 DIAGNOSIS — I288 Other diseases of pulmonary vessels: Secondary | ICD-10-CM | POA: Diagnosis not present

## 2019-10-03 DIAGNOSIS — M069 Rheumatoid arthritis, unspecified: Secondary | ICD-10-CM | POA: Diagnosis not present

## 2019-10-03 DIAGNOSIS — J4540 Moderate persistent asthma, uncomplicated: Secondary | ICD-10-CM | POA: Diagnosis not present

## 2019-10-03 DIAGNOSIS — R0602 Shortness of breath: Secondary | ICD-10-CM

## 2019-10-03 DIAGNOSIS — Z8616 Personal history of COVID-19: Secondary | ICD-10-CM

## 2019-10-03 NOTE — Patient Instructions (Signed)
The patient should have follow up scheduled with myself in 5-6 months.   By learning about asthma and how it can be controlled, you take an important step toward managing this disease. Work closely with your asthma care team to learn all you can about your asthma, how to avoid triggers, what your medications do, and how to take them correctly. With proper care, you can live free of asthma symptoms and maintain a normal, healthy lifestyle.   What is asthma? Asthma is a chronic disease that affects the airways of the lungs. During normal breathing, the bands of muscle that surround the airways are relaxed and air moves freely. During an asthma episode or "attack," there are three main changes that stop air from moving easily through the airways:  The bands of muscle that surround the airways tighten and make the airways narrow. This tightening is called bronchospasm.   The lining of the airways becomes swollen or inflamed.   The cells that line the airways produce more mucus, which is thicker than normal and clogs the airways.  These three factors - bronchospasm, inflammation, and mucus production - cause symptoms such as difficulty breathing, wheezing, and coughing.  What are the most common symptoms of asthma? Asthma symptoms are not the same for everyone. They can even change from episode to episode in the same person. Also, you may have only one symptom of asthma, such as cough, but another person may have all the symptoms of asthma. It is important to know all the symptoms of asthma and to be aware that your asthma can present in any of these ways at any time. The most common symptoms include: . Coughing, especially at night  . Shortness of breath  . Wheezing  . Chest tightness, pain, or pressure   Who is affected by asthma? Asthma affects 22 million Americans; about 6 million of these are children under age 44. People who have a family history of asthma have an increased risk of  developing the disease. Asthma is also more common in people who have allergies or who are exposed to tobacco smoke. However, anyone can develop asthma at any time. Some people may have asthma all of their lives, while others may develop it as adults.  What causes asthma? The airways in a person with asthma are very sensitive and react to many things, or "triggers." Contact with these triggers causes asthma symptoms. One of the most important parts of asthma control is to identify your triggers and then avoid them when possible. The only trigger you do not want to avoid is exercise. Pre-treatment with medicines before exercise can allow you to stay active yet avoid asthma symptoms. Common asthma triggers include: 1. Infections (colds, viruses, flu, sinus infections)  2. Exercise  3. Weather (changes in temperature and/or humidity, cold air)  4. Tobacco smoke  5. Allergens (dust mites, pollens, pets, mold spores, cockroaches, and sometimes foods)  6. Irritants (strong odors from cleaning products, perfume, wood smoke, air pollution)  7. Strong emotions such as crying or laughing hard  8. Some medications   How is asthma diagnosed? To diagnose asthma, your doctor will first review your medical history, family history, and symptoms. Your doctor will want to know any past history of breathing problems you may have had, as well as a family history of asthma, allergies, eczema (a bumpy, itchy skin rash caused by allergies), or other lung disease. It is important that you describe your symptoms in detail (cough, wheeze, shortness  of breath, chest tightness), including when and how often they occur. The doctor will perform a physical examination and listen to your heart and lungs. He or she may also order breathing tests, allergy tests, blood tests, and chest and sinus X-rays. The tests will find out if you do have asthma and if there are any other conditions that are contributing factors.  How is asthma  treated? Asthma can be controlled, but not cured. It is not normal to have frequent symptoms, trouble sleeping, or trouble completing tasks. Appropriate asthma care will prevent symptoms and visits to the emergency room and hospital. Asthma medicines are one of the mainstays of asthma treatment. The drugs used to treat asthma are explained below.  Anti-inflammatories: These are the most important drugs for most people with asthma. Anti-inflammatory drugs reduce swelling and mucus production in the airways. As a result, airways are less sensitive and less likely to react to triggers. These medications need to be taken daily and may need to be taken for several weeks before they begin to control asthma. Anti-inflammatory medicines lead to fewer symptoms, better airflow, less sensitive airways, less airway damage, and fewer asthma attacks. If taken every day, they CONTROL or prevent asthma symptoms.   Bronchodilators: These drugs relax the muscle bands that tighten around the airways. This action opens the airways, letting more air in and out of the lungs and improving breathing. Bronchodilators also help clear mucus from the lungs. As the airways open, the mucus moves more freely and can be coughed out more easily. In short-acting forms, bronchodilators RELIEVE or stop asthma symptoms by quickly opening the airways and are very helpful during an asthma episode. In long-acting forms, bronchodilators provide CONTROL of asthma symptoms and prevent asthma episodes.  Asthma drugs can be taken in a variety of ways. Inhaling the medications by using a metered dose inhaler, dry powder inhaler, or nebulizer is one way of taking asthma medicines. Oral medicines (pills or liquids you swallow) may also be prescribed.  Asthma severity Asthma is classified as either "intermittent" (comes and goes) or "persistent" (lasting). Persistent asthma is further described as being mild, moderate, or severe. The severity of asthma is  based on how often you have symptoms both during the day and night, as well as by the results of lung function tests and by how well you can perform activities. The "severity" of asthma refers to how "intense" or "strong" your asthma is.  Asthma control Asthma control is the goal of asthma treatment. Regardless of your asthma severity, it may or may not be controlled. Asthma control means: . You are able to do everything you want to do at work and home  . You have no (or minimal) asthma symptoms  . You do not wake up from your sleep or earlier than usual in the morning due to asthma  . You rarely need to use your reliever medicine (inhaler)  Another major part of your treatment is that you are happy with your asthma care and believe your asthma is controlled.  Monitoring symptoms A key part of treatment is keeping track of how well your lungs are working. Monitoring your symptoms  what they are, how and when they happen, and how severe they are  is an important part of being able to control your asthma.  Sometimes asthma is monitored using a peak flow meter. A peak flow (PF) meter measures how fast the air comes out of your lungs. It can help you know when  your asthma is getting worse, sometimes even before you have symptoms. By taking daily peak flow readings, you can learn when to adjust medications to keep asthma under good control. It is also used to create your asthma action plan (see below). Your doctor can use your peak flow readings to adjust your treatment plan in some cases.  Asthma Action Plan Based on your history and asthma severity, you and your doctor will develop a care plan called an "asthma action plan." The asthma action plan describes when and how to use your medicines, actions to take when asthma worsens, and when to seek emergency care. Make sure you understand this plan. If you do not, ask your asthma care provider any questions you may have. Your asthma action plan is  one of the keys to controlling asthma. Keep it readily available to remind you of what you need to do every day to control asthma and what you need to do when symptoms occur.  Goals of asthma therapy These are the goals of asthma treatment: . Live an active, normal life  . Prevent chronic and troublesome symptoms  . Attend work or school every day  . Perform daily activities without difficulty  . Stop urgent visits to the doctor, emergency department, or hospital  . Use and adjust medications to control asthma with few or no side effects

## 2019-10-03 NOTE — Progress Notes (Signed)
Chala Gul    [id number]    [birth year]  Primary Care Physician:Rich[place], Maebelle Munroe, MD Date of Appointment: 10/03/2019 Established Patient Visit  Chief complaint:   Chief Complaint  Patient presents with  . Follow-up    inhalers working. dry cough at times.    HPI: Ms. Begeman is a 44 y.o. woman who initially presented in March 2021 for shortness of breath and wheezing.  She had Covid 19 pneumonia in April 2020 and was hospitalized for this.  She additionally has rheumatoid arthritis and is on Plaquenil.  Interval Updates: Since last visit was started on [facility] HFA twice a day.  Also resumed on albuterol as needed. Cough improved on PPI.   Current Regimen: Dulera HFA twice daily, albuterol as needed Asthma Triggers: Seasonal allergies Exacerbations in the last year: none History of hospitalization or intubation: none Hives: none Allergy Testing: GERD: yes on PPI Allergic Rhinitis: occasional ACT:  Asthma Control Test ACT Total Score  10/03/2019 22   FeNO: none on file  I have reviewed the patient's family social and past medical history and updated as appropriate.   Past Medical History:  Diagnosis Date  . Abnormal vaginal bleeding   . Anemia   . Asthma    seasonal asthma-using inhaler daily at present  . Complication of anesthesia    panic attack after breast biopsy 20 years ago  . DM II (diabetes mellitus, type II), controlled ([place])    metformin 590m qd  . Fibroids   . GERD (gastroesophageal reflux disease)    occasional indigestion-tums prn  . Heart murmur   . Rheumatoid arthritis ([facility]     Past Surgical History:  Procedure Laterality Date  . ABDOMINAL HYSTERECTOMY    . BIOPSY BREAST  age 44  benign   . CYSTOSCOPY  10/13/2011   Procedure: CYSTOSCOPY;  Surgeon: MEmily Filbert MD;  Location: [facility];  Service: Gynecology;  Laterality: N/A;  . TUBAL LIGATION      Family History  Problem Relation Age of Onset  . Heart disease Mother   .  Diabetes Maternal Grandmother   . Prostate cancer Father   . Healthy Sister   . Healthy Brother   . Healthy Brother   . Healthy Son   . Diabetes Daughter   . Healthy Daughter   . Asthma Daughter   . Healthy Daughter     Social History   Occupational History  . Not on file  Tobacco Use  . Smoking status: Never Smoker  . Smokeless tobacco: Never Used  Substance and Sexual Activity  . Alcohol use: No  . Drug use: No  . Sexual activity: Yes    Birth control/protection: Surgical    Review of systems: Constitutional: No fevers, chills, night sweats, or weight loss. CV: No chest pain, or palpitations. Resp: No hemoptysis.  Physical Exam: -Limited due to nature of visit. Last menstrual period 07/09/2011.  Lungs:    No increased respiratory effort, able to complete full sentences no audible wheezing or cough  Data Reviewed: Imaging: I have personally reviewed the chest x-ray from March 2021 which shows trace right-sided pleural effusion only. . Review of patient's CT Angio March 2021 chestimages revealed enlarged pulmonary artery, trace left sided pleural effusion, patulous esophagus. The patient's images have been independently reviewed by me.     PFTs: Spirometry obtained April 2021 demonstrates mild airflow limitation  Labs: ESR 45.   Immunization status: Immunization History  Administered  Date(s) Administered  . Influenza Split 08/22/2011  . Influenza,inj,Quad PF,6+ Mos 03/24/2019  . PFIZER SARS-COV-2 Vaccination 08/17/2019, 09/14/2019  . 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 Covid 19 infection April 2020  Plan/Recommendations: Continue Dulera HFA twice daily with as needed albuterol Continue PPI Echo is scheduled for April 14. I will contact her with results if she needs any further work up with this for  enlarged PA.    Return to Care: Return in about 5 months (around 03/04/2020).   Telephone visit. Total time 25 minutes.   Nikita Desai, MD Pulmonary and Critical Care Medicine [facility] Office:[phone]      

## 2019-10-05 ENCOUNTER — Ambulatory Visit (HOSPITAL_COMMUNITY): Payer: PRIVATE HEALTH INSURANCE | Attending: Cardiovascular Disease

## 2019-10-05 ENCOUNTER — Other Ambulatory Visit: Payer: Self-pay

## 2019-10-05 DIAGNOSIS — R0602 Shortness of breath: Secondary | ICD-10-CM | POA: Diagnosis present

## 2019-10-05 DIAGNOSIS — I288 Other diseases of pulmonary vessels: Secondary | ICD-10-CM

## 2019-10-05 NOTE — Progress Notes (Signed)
Echocardiogram is normal without any signs of pulmonary hypertension. Can continue current treatment for asthma and follow up in a few months as discussed.

## 2019-10-25 NOTE — Progress Notes (Deleted)
Office Visit Note  Patient: Kaitlyn Rodgers             Date of Birth: 01-17-1976           MRN: [id number]             PCP: Kaitlyn Rasmussen, MD Referring: Kaitlyn Rasmussen, MD Visit Date: 11/01/2019 Occupation: @GUAROCC @  Subjective:  No chief complaint on file.   History of Present Illness: Kaitlyn Rodgers is a 44 y.o. female ***   Activities of Daily Living:  Patient reports morning stiffness for *** {minute/hour:19697}.   Patient {ACTIONS;DENIES/REPORTS:21021675::"Denies"} nocturnal pain.  Difficulty dressing/grooming: {ACTIONS;DENIES/REPORTS:21021675::"Denies"} Difficulty climbing stairs: {ACTIONS;DENIES/REPORTS:21021675::"Denies"} Difficulty getting out of chair: {ACTIONS;DENIES/REPORTS:21021675::"Denies"} Difficulty using hands for taps, buttons, cutlery, and/or writing: {ACTIONS;DENIES/REPORTS:21021675::"Denies"}  No Rheumatology ROS completed.   PMFS History:  Patient Active Problem List   Diagnosis Date Noted  . History of diabetes mellitus, type II 05/02/2019  . SIRS (systemic inflammatory response syndrome) ([place]) 10/20/2018  . Acute on chronic respiratory failure with hypoxia ([place]) 10/19/2018  . Type 2 diabetes mellitus without complication ([place]) [id number]  . Rheumatoid arthritis ([place]) 10/19/2018  . Pneumonia due to COVID-19 virus 10/17/2018  . DUB (dysfunctional uterine bleeding) 01/24/2011  . Thickened endometrium 01/24/2011    Past Medical History:  Diagnosis Date  . Abnormal vaginal bleeding   . Anemia   . Asthma    seasonal asthma-using inhaler daily at present  . Complication of anesthesia    panic attack after breast biopsy 20 years ago  . DM II (diabetes mellitus, type II), controlled ([place])    metformin 500mg  qd  . Fibroids   . GERD (gastroesophageal reflux disease)    occasional indigestion-tums prn  . Heart murmur   . Rheumatoid arthritis ([place])     Family History  Problem Relation Age of Onset  . Heart disease Mother   . Diabetes  Maternal Grandmother   . Prostate cancer Father   . Healthy Sister   . Healthy Brother   . Healthy Brother   . Healthy Son   . Diabetes Daughter   . Healthy Daughter   . Asthma Daughter   . Healthy Daughter    Past Surgical History:  Procedure Laterality Date  . ABDOMINAL HYSTERECTOMY    . BIOPSY BREAST  age 49   benign   . CYSTOSCOPY  10/13/2011   Procedure: CYSTOSCOPY;  Surgeon: Emily Filbert, MD;  Location: [facility];  Service: Gynecology;  Laterality: N/A;  . TUBAL LIGATION     Social History   Social History Narrative  . Not on file   Immunization History  Administered Date(s) Administered  . Influenza Split 08/22/2011  . Influenza,inj,Quad PF,6+ Mos 03/24/2019  . PFIZER SARS-COV-2 Vaccination 08/17/2019, 09/14/2019  . Pneumococcal Polysaccharide-23 10/13/2011     Objective: Vital Signs: LMP 07/09/2011    Physical Exam   Musculoskeletal Exam: ***  CDAI Exam: CDAI Score: -- Patient Global: --; Provider Global: -- Swollen: --; Tender: -- Joint Exam 11/01/2019   No joint exam has been documented for this visit   There is currently no information documented on the homunculus. Go to the Rheumatology activity and complete the homunculus joint exam.  Investigation: No additional findings.  Imaging: [place] Extrem Up Bilat Comp  Result Date: 09/28/2019 Ultrasound examination of bilateral hands was performed per EULAR recommendations. Using 12 MHz transducer, grayscale and power Doppler bilateral second, third, and fifth MCP joints and bilateral wrist joints both dorsal and volar aspects were evaluated to look  for synovitis or tenosynovitis. The findings were there was no synovitis or tenosynovitis on ultrasound examination. 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 Impression: Ultrasound examination did not show any synovitis.   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    ECHOCARDIOGRAM COMPLETE  Result Date: 10/05/2019    ECHOCARDIOGRAM REPORT   Patient Name:   Kaitlyn Rodgers Date of Exam: 10/05/2019 Medical Rec #:  [id number]       Height:       64.0 in Accession #:    [id number]      Weight:       334.6 lb Date of Birth:  [birth year]       BSA:          2.435 m Patient Age:    40 years        BP:           127/82 mmHg Patient Gender: F               HR:           80 bpm. Exam Location:  [address] Procedure: 2D Echo, Cardiac Doppler and Color Doppler Indications:    I27.20 Pulmonary Hypertension  History:        Patient has no prior history of Echocardiogram examinations.                 Signs/Symptoms:Shortness of Breath; Risk Factors:Diabetes.                 COVID-19. Enlarged pulmonary artery. Asthma. Acute on chronic                 respiratory failure. SIRS. Rheumatoid arthritis. Morbid obesity.  Sonographer:    Diamond Nickel RCS Referring Phys: [id number] [place]  1. Normal GLS -21.3 . Left ventricular ejection fraction, by estimation, is 60 to 65%. The left ventricle has normal function. The left ventricle has no regional wall motion abnormalities. There is mild left ventricular hypertrophy. Left ventricular diastolic parameters were normal.  2. Right ventricular systolic function is normal. The right ventricular size is normal.  3. Left atrial size was mildly dilated.  4. The mitral valve is normal in structure. Trivial mitral valve regurgitation. No evidence of mitral stenosis.  5. The aortic valve is tricuspid. Aortic valve regurgitation is not visualized. Mild aortic valve sclerosis is present, with no evidence of aortic valve stenosis.  6. The inferior vena cava is normal in size with greater than 50% respiratory variability, suggesting right atrial pressure of 3 mmHg. FINDINGS  Left Ventricle: Normal GLS -21.3. Left ventricular ejection fraction, by estimation, is 60 to 65%. The left  ventricle has normal function. The left ventricle has no regional wall motion abnormalities. The left ventricular internal cavity size was normal  in size. There is mild left ventricular hypertrophy. Left ventricular diastolic parameters were normal. Right Ventricle: The right ventricular size is normal. No increase in right ventricular wall thickness. Right ventricular systolic function is normal. Left Atrium: Left atrial size was mildly dilated. Right Atrium: Right atrial size was normal in size. Pericardium: There is no evidence of pericardial effusion. Mitral Valve: The mitral valve is normal in structure. There is mild thickening of the mitral valve leaflet(s). There is mild calcification of the mitral valve leaflet(s). Normal  mobility of the mitral valve leaflets. Trivial mitral valve regurgitation. No evidence of mitral valve stenosis. Tricuspid Valve: The tricuspid valve is normal in structure. Tricuspid valve regurgitation is trivial. No evidence of tricuspid stenosis. Aortic Valve: The aortic valve is tricuspid. Aortic valve regurgitation is not visualized. Mild aortic valve sclerosis is present, with no evidence of aortic valve stenosis. Pulmonic Valve: The pulmonic valve was normal in structure. Pulmonic valve regurgitation is not visualized. No evidence of pulmonic stenosis. Aorta: The aortic root is normal in size and structure. Venous: The inferior vena cava is normal in size with greater than 50% respiratory variability, suggesting right atrial pressure of 3 mmHg. IAS/Shunts: No atrial level shunt detected by color flow Doppler.  LEFT VENTRICLE PLAX 2D LVIDd:         4.30 cm  Diastology LVIDs:         2.80 cm  LV e' lateral:   16.30 cm/s LV PW:         1.30 cm  LV E/e' lateral: 6.0 LV IVS:        1.20 cm  LV e' medial:    9.03 cm/s LVOT diam:     2.30 cm  LV E/e' medial:  10.8 LV SV:         103 LV SV Index:   42 LVOT Area:     4.15 cm  RIGHT VENTRICLE RV S prime:     13.20 cm/s TAPSE (M-mode): 2.1  cm LEFT ATRIUM             Index LA diam:        3.80 cm 1.56 cm/m LA Vol (A2C):   42.8 ml 17.58 ml/m LA Vol (A4C):   42.8 ml 17.58 ml/m LA Biplane Vol: 42.7 ml 17.54 ml/m  AORTIC VALVE LVOT Vmax:   129.00 cm/s LVOT Vmean:  90.000 cm/s LVOT VTI:    0.248 m  AORTA Ao Root diam: 2.60 cm MITRAL VALVE MV Area (PHT): 3.48 cm    SHUNTS MV Decel Time: 218 msec    Systemic VTI:  0.25 m MV E velocity: 97.90 cm/s  Systemic Diam: 2.30 cm MV A velocity: 86.90 cm/s MV E/A ratio:  1.13 Jenkins Rouge MD Electronically signed by Jenkins Rouge MD Signature Date/Time: 10/05/2019/11:30:43 AM    Final     Recent Labs: Lab Results  Component Value Date   WBC 8.1 08/28/2019   HGB 12.4 08/28/2019   PLT 435 (H) 08/28/2019   NA 139 08/28/2019   K 4.5 08/28/2019   CL 101 08/28/2019   CO2 29 08/28/2019   GLUCOSE 130 (H) 08/28/2019   BUN 14 08/28/2019   CREATININE 0.58 08/28/2019   BILITOT 0.6 08/28/2019   ALKPHOS 59 08/28/2019   AST 19 08/28/2019   ALT 21 08/28/2019   PROT 7.7 08/28/2019   ALBUMIN 3.5 08/28/2019   CALCIUM 9.7 08/28/2019   GFRAA >60 08/28/2019    Speciality Comments: PLQ Eye Exam: 07/15/19 WNL with Dr Delman Cheadle follow up in 1 year   Procedures:  No procedures performed Allergies: Patient has no known allergies.   Assessment / Plan:     Visit Diagnoses: No diagnosis found.  Orders: No orders of the defined types were placed in this encounter.  No orders of the defined types were placed in this encounter.   Face-to-face time spent with patient was *** minutes. Greater than 50% of time was spent in counseling and coordination of care.  Follow-Up Instructions: No follow-ups on file.  Ofilia Neas, PA-C  Note - This record has been created using Dragon software.  Chart creation errors have been sought, but may not always  have been located. Such creation errors do not reflect on  the standard of medical care.

## 2019-11-01 ENCOUNTER — Ambulatory Visit: Payer: PRIVATE HEALTH INSURANCE | Admitting: Rheumatology

## 2019-11-01 DIAGNOSIS — Z8269 Family history of other diseases of the musculoskeletal system and connective tissue: Secondary | ICD-10-CM

## 2019-11-01 DIAGNOSIS — M0609 Rheumatoid arthritis without rheumatoid factor, multiple sites: Secondary | ICD-10-CM

## 2019-11-01 DIAGNOSIS — M19071 Primary osteoarthritis, right ankle and foot: Secondary | ICD-10-CM

## 2019-11-01 DIAGNOSIS — E119 Type 2 diabetes mellitus without complications: Secondary | ICD-10-CM

## 2019-11-01 DIAGNOSIS — G8929 Other chronic pain: Secondary | ICD-10-CM

## 2019-11-01 DIAGNOSIS — M722 Plantar fascial fibromatosis: Secondary | ICD-10-CM

## 2019-11-01 DIAGNOSIS — Z79899 Other long term (current) drug therapy: Secondary | ICD-10-CM

## 2019-11-01 DIAGNOSIS — M2141 Flat foot [pes planus] (acquired), right foot: Secondary | ICD-10-CM

## 2019-11-20 ENCOUNTER — Other Ambulatory Visit: Payer: Self-pay | Admitting: Rheumatology

## 2019-11-20 DIAGNOSIS — M0609 Rheumatoid arthritis without rheumatoid factor, multiple sites: Secondary | ICD-10-CM

## 2019-11-22 MED ORDER — HYDROXYCHLOROQUINE SULFATE 200 MG PO TABS: 200.0000 mg | ORAL_TABLET | Freq: Two times a day (BID) | ORAL | 0 refills | Status: AC

## 2019-11-22 NOTE — Telephone Encounter (Signed)
Last Visit: 09/19/2019 Next Visit: due May 2021. Message sent to the front to schedule patient.  Labs: 08/28/2019 CBC and CMP were stable Eye exam: 07/15/2019 WNL  Current Dose per office note 09/19/2019: PLQ 200 mg 1 tablet by mouth twice daily.  Okay to refill per Dr. Estanislado Pandy

## 2019-11-22 NOTE — Telephone Encounter (Signed)
Please schedule patient for a follow up visit. Patient was due May 2021. Thanks! 

## 2019-11-22 NOTE — Telephone Encounter (Signed)
LMOM for patient to call and schedule a follow up appointment

## 2020-05-21 IMAGING — DX PORTABLE CHEST - 1 VIEW
1 series · 1 of 1 positions shown · non-contrast
Comparison: None.

CLINICAL DATA: Fever and short of breath, [id number] positive

EXAM:
PORTABLE CHEST 1 VIEW

[chest ap]
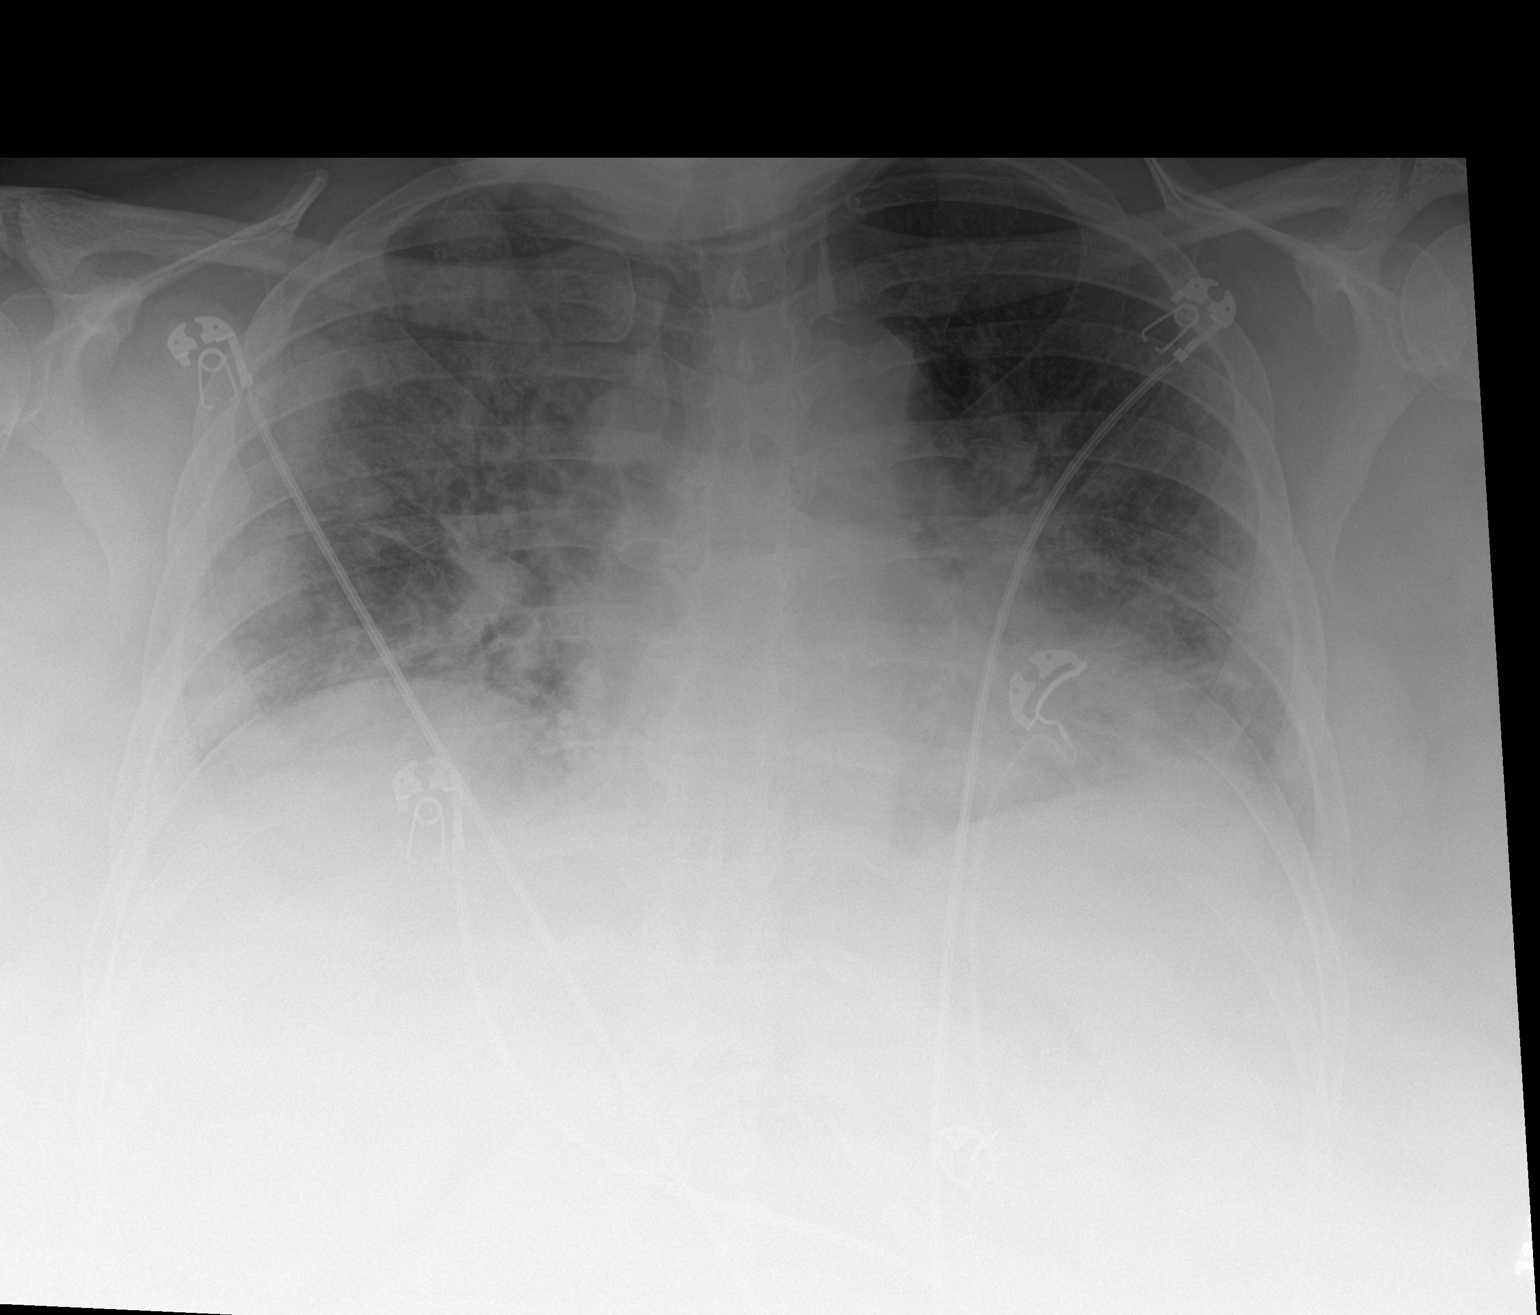

[1 of 1 positions shown; findings below may reference images not displayed]

FINDINGS: Low lung volumes. Patchy peripheral right greater than left opacity.
Patchy left basilar opacity. No pleural effusion. Heart size upper
normal. No pneumothorax.
IMPRESSION: Low lung volumes with patchy right greater than left largely
peripheral infiltrates, consistent with atypical/viral pneumonia

## 2021-04-01 IMAGING — US US ABDOMEN COMPLETE
1 series · 14 of 25 positions shown · non-contrast
Comparison: None.

CLINICAL DATA: Right upper quadrant pain and right flank pain for 1
week.

EXAM:
ABDOMEN ULTRASOUND COMPLETE

[Series 1: us abdomen complete · 14 of 90 slices shown]
[im 1/90]
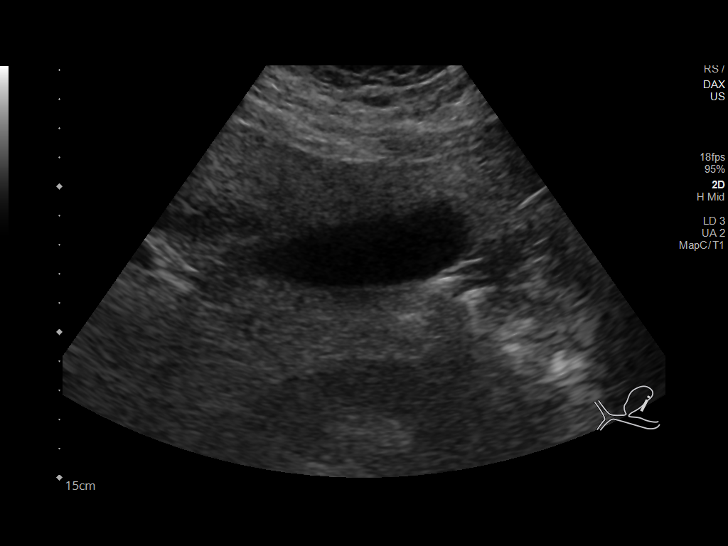
[im 8/90]
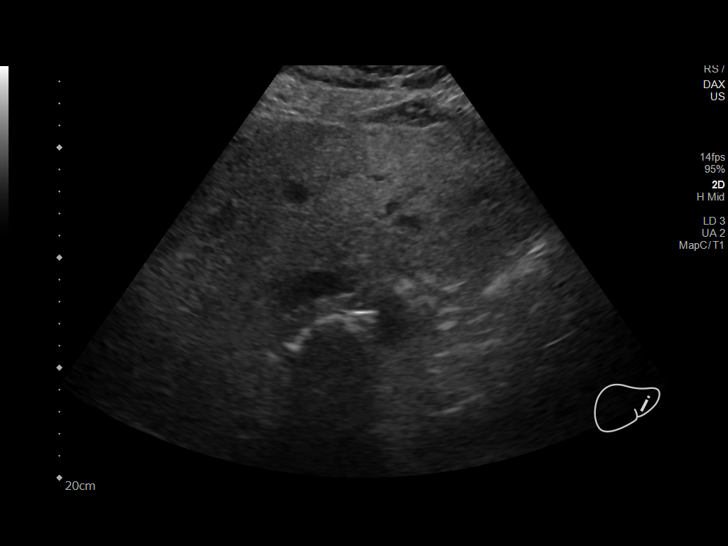
[im 15/90]
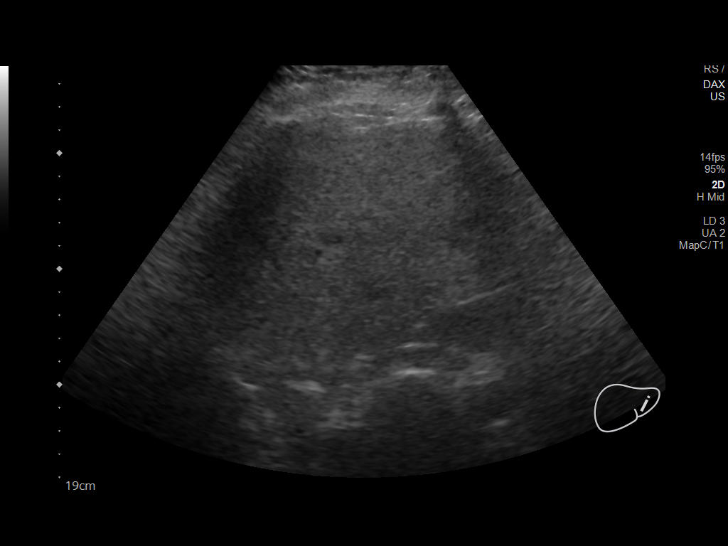
[im 23/90]
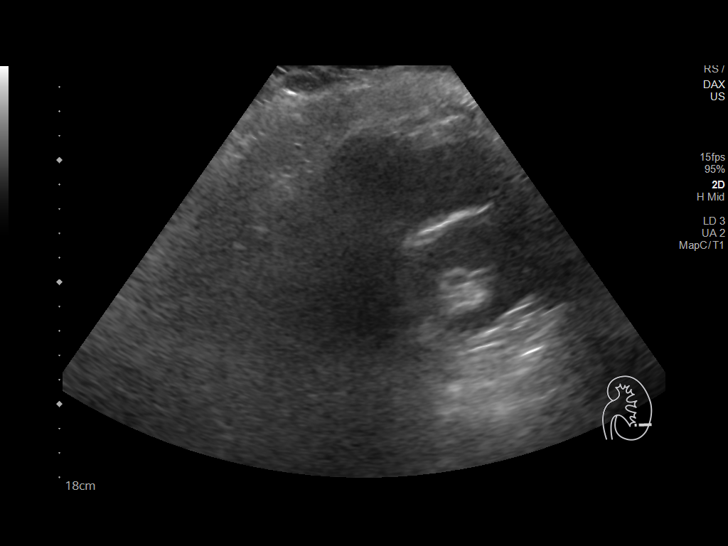
[im 30/90]
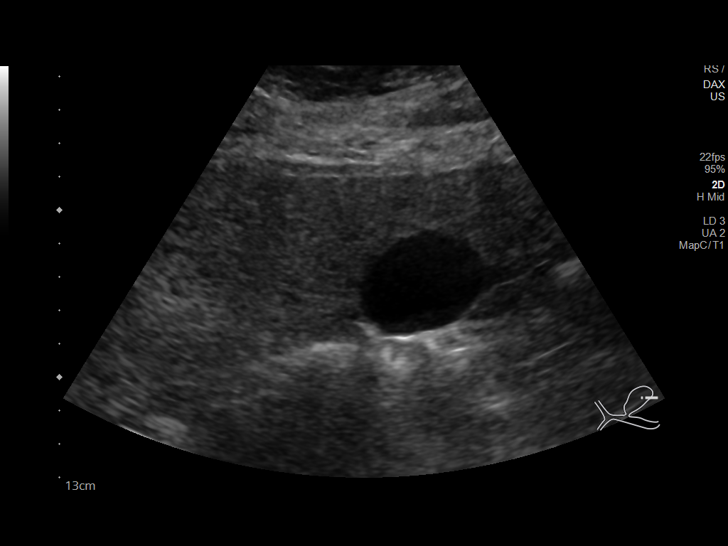
[im 34/90]
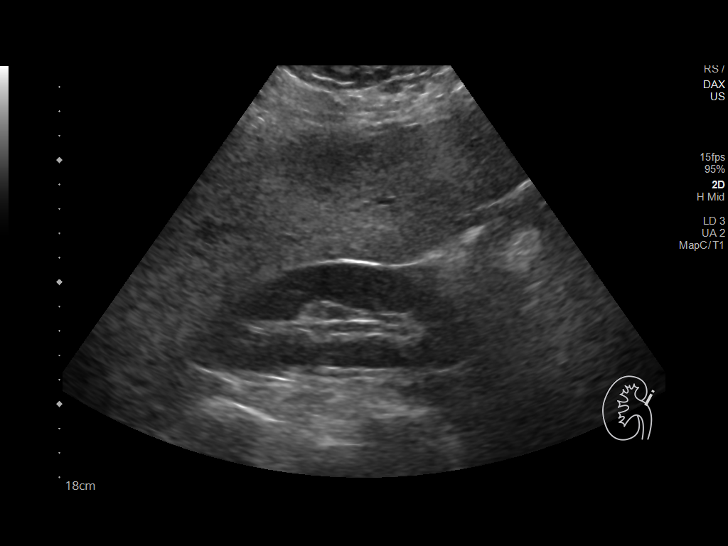
[im 41/90]
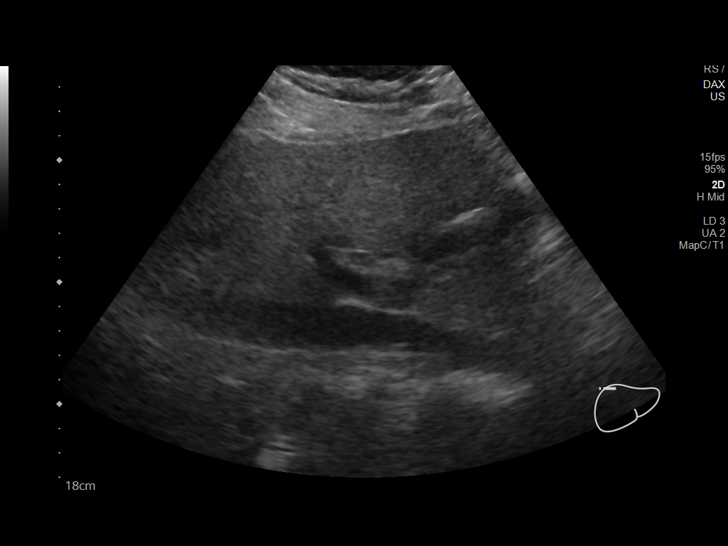
[im 49/90]
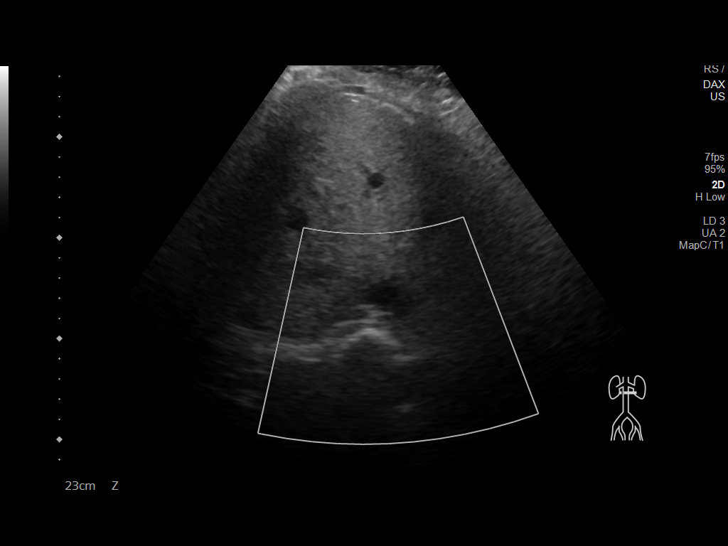
[im 56/90]
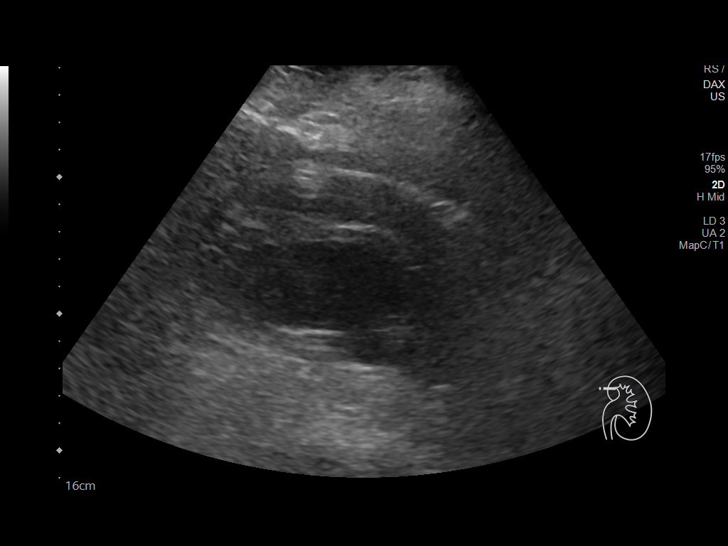
[im 60/90]
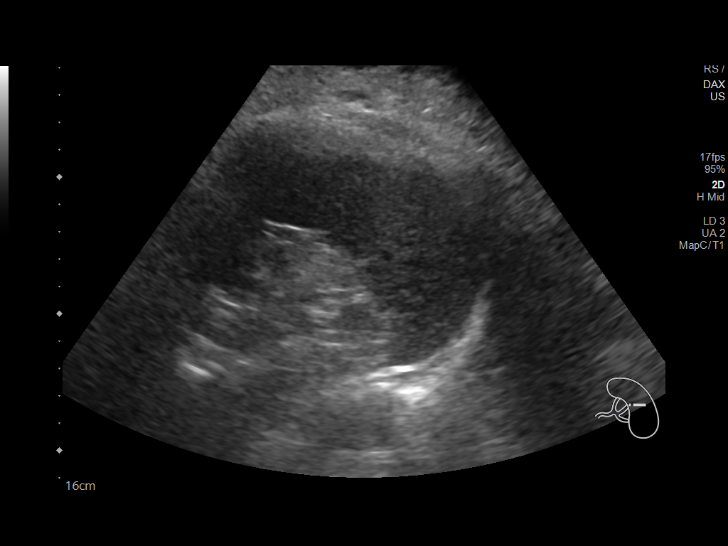
[im 67/90]
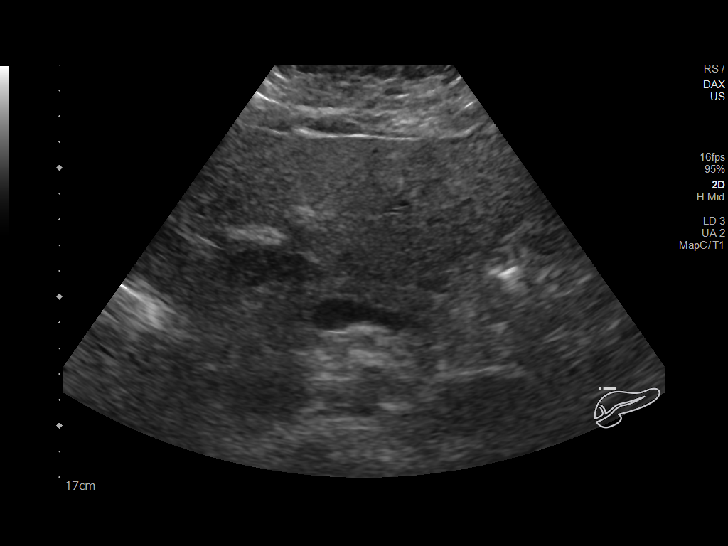
[im 75/90]
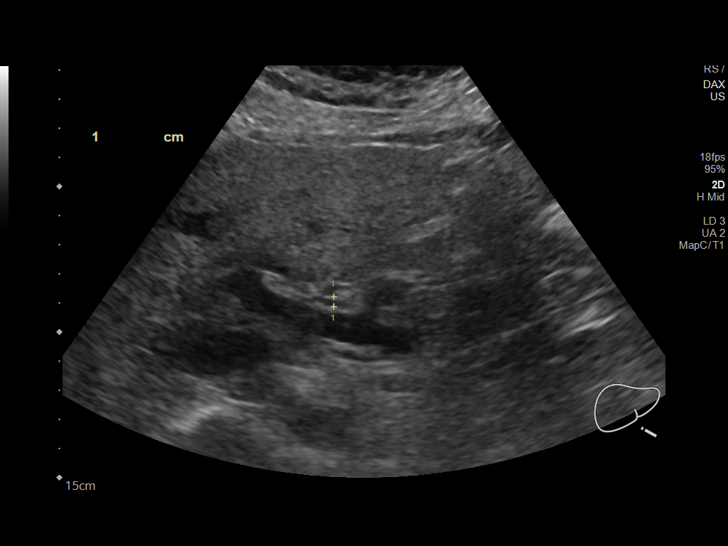
[im 82/90]
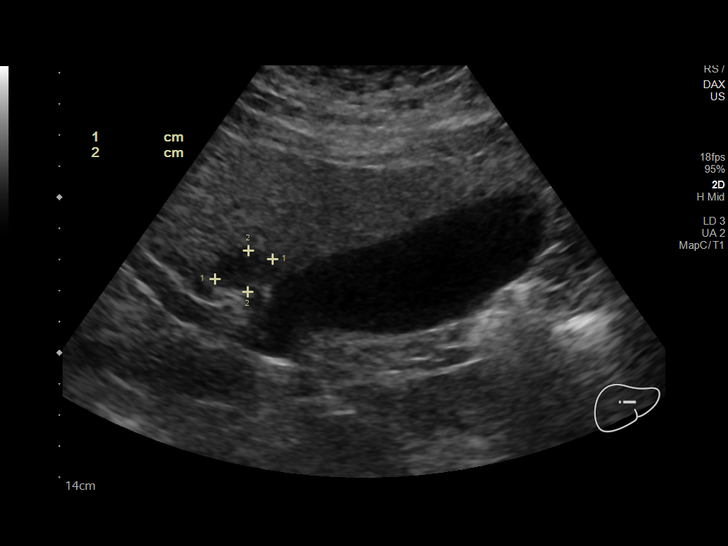
[im 90/90]
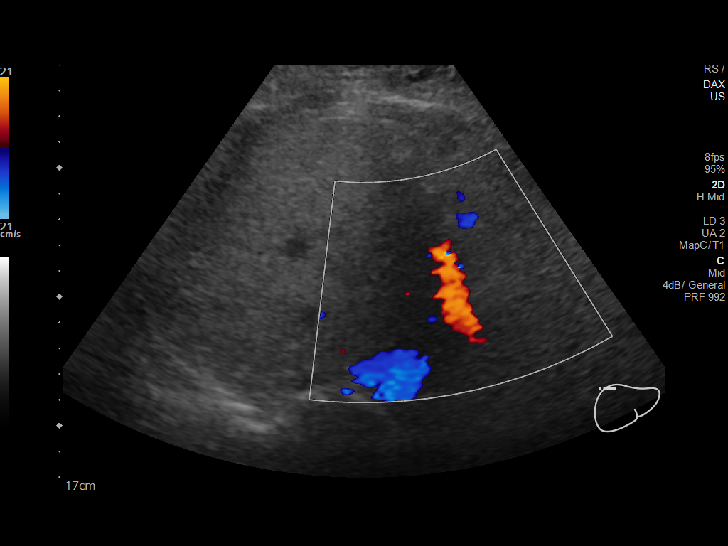

[14 of 25 positions shown; findings below may reference images not displayed]

FINDINGS: Gallbladder: No gallstones or wall thickening visualized. No
sonographic Murphy sign noted by sonographer.

Common bile duct: Diameter: 3 mm, within normal limits.

Liver: Diffusely increased and coarsened hepatic echotexture is
seen, consistent with diffuse hepatocellular disease. Focal area of
fatty sparing is seen centrally, adjacent to the porta hepatis. No
liver mass identified. Portal vein is patent on color Doppler
imaging with normal direction of blood flow towards the liver.

IVC: No abnormality visualized.

Pancreas: Visualized portion unremarkable.

Spleen: Size and appearance within normal limits.

Right Kidney: Length: 12.6 cm. Echogenicity within normal limits. No
mass or hydronephrosis visualized.

Left Kidney: Length: 11.0 cm. Echogenicity within normal limits. No
mass or hydronephrosis visualized.

Abdominal aorta: No aneurysm visualized.

Other findings: None.
IMPRESSION: No evidence of cholelithiasis or biliary ductal dilatation.

Diffuse hepatocellular disease.

No evidence of hydronephrosis.

## 2021-04-01 IMAGING — CT CT ANGIO CHEST
2 of 7 series · 18 of 46 positions shown · IV contrast (APPLIED)
Comparison: Chest radiograph dated 10/17/2018.

CLINICAL DATA: 43-year-old female with right-sided chest pain.

EXAM:
CT ANGIOGRAPHY CHEST WITH CONTRAST
TECHNIQUE: Multidetector CT imaging of the chest was performed using the
standard protocol during bolus administration of intravenous
contrast. Multiplanar CT image reconstructions and MIPs were
obtained to evaluate the vascular anatomy.
CONTRAST:  100mL OMNIPAQUE IOHEXOL 350 MG/ML SOLN

[Series 6: thins · axial · 0.67mm/px · z∈[-270,-64]mm · 15 of 332 slices shown]
[im 19/332  lung]
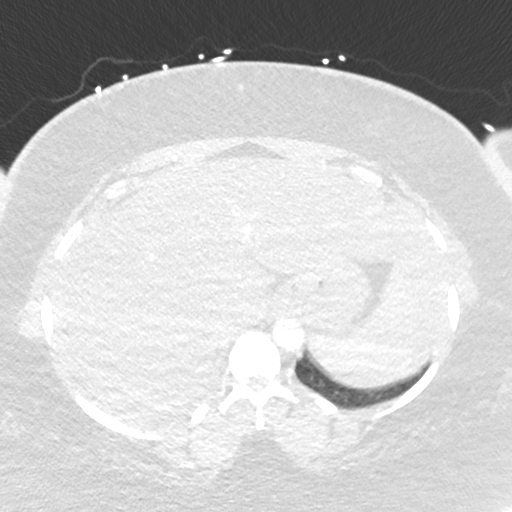
[im 37/332  soft-tissue]
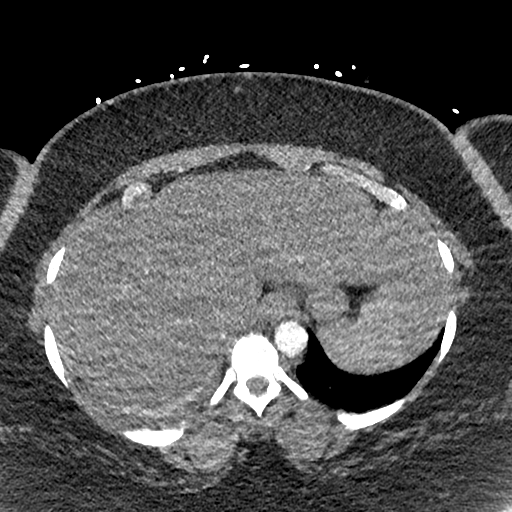
[im 56/332  lung]
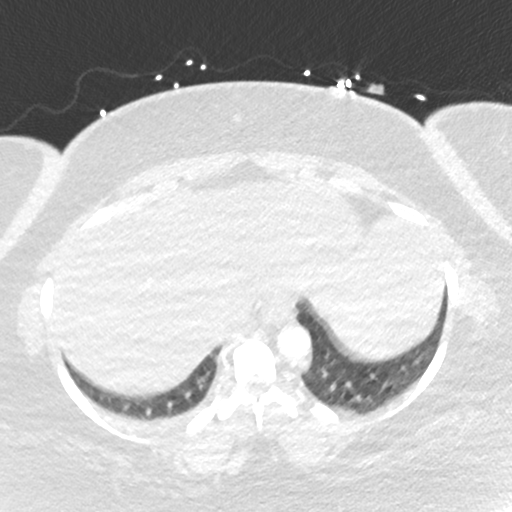
[im 74/332  soft-tissue]
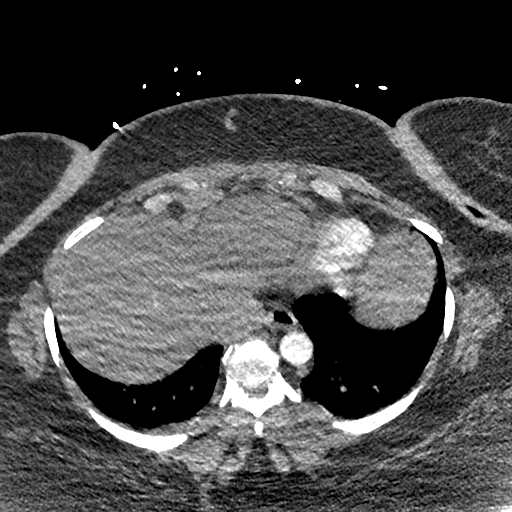
[im 111/332  lung]
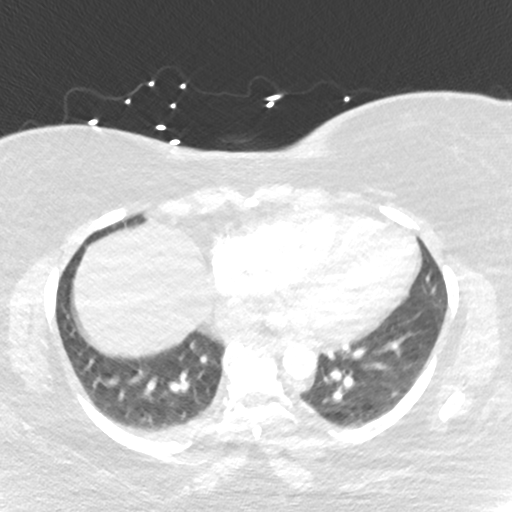
[im 129/332  soft-tissue]
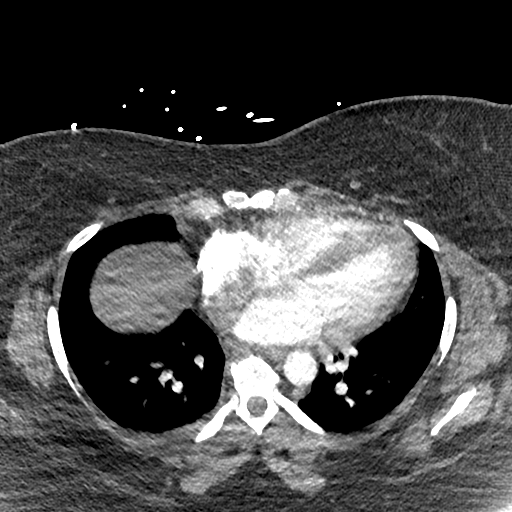
[im 148/332  lung]
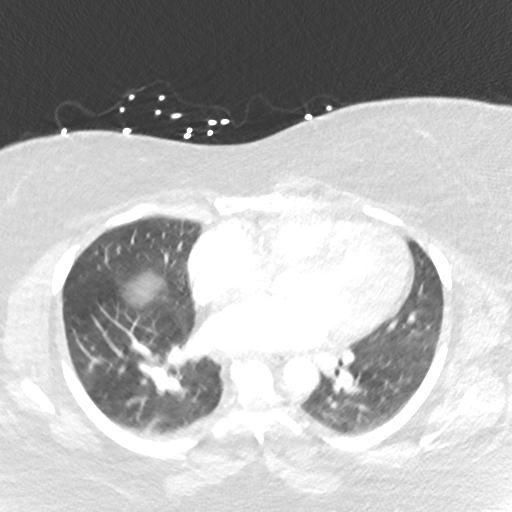
[im 166/332  soft-tissue]
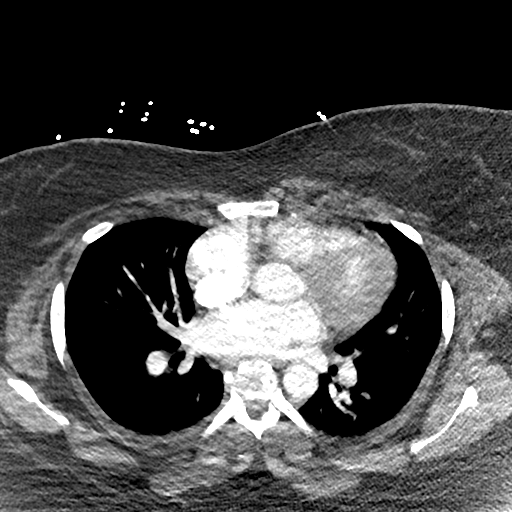
[im 184/332  lung]
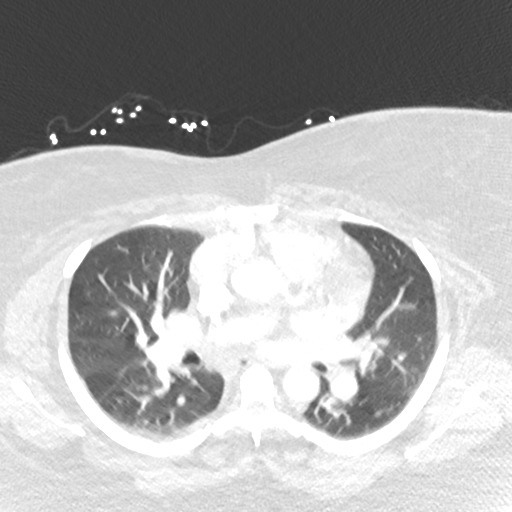
[im 203/332  soft-tissue]
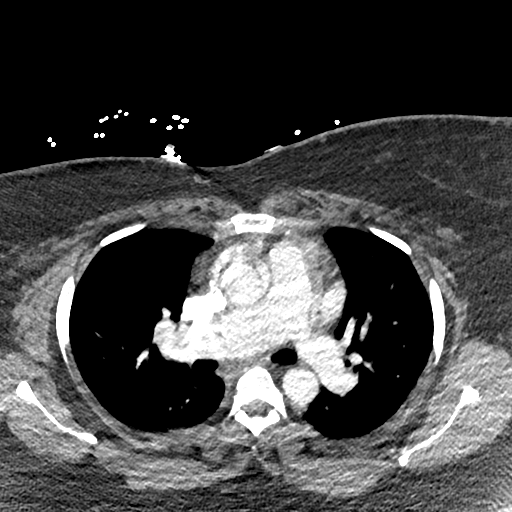
[im 221/332  lung]
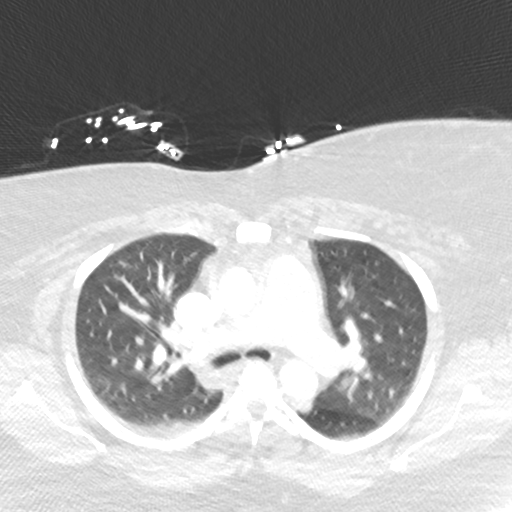
[im 258/332  soft-tissue]
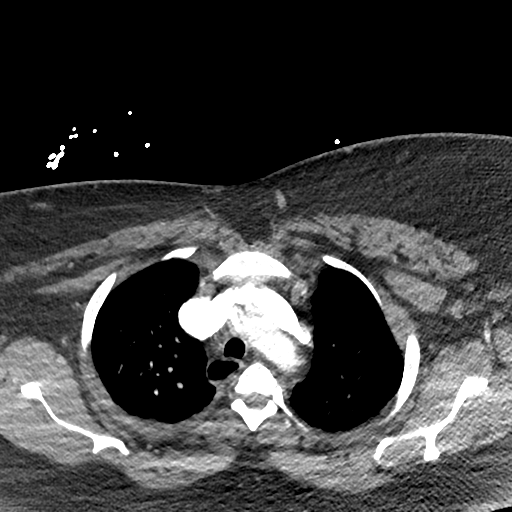
[im 276/332  lung]
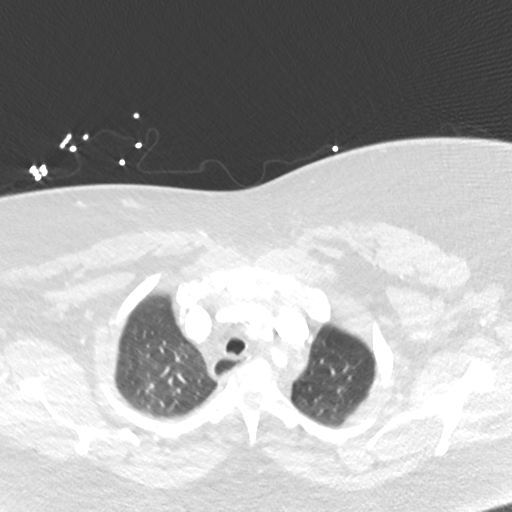
[im 295/332  soft-tissue]
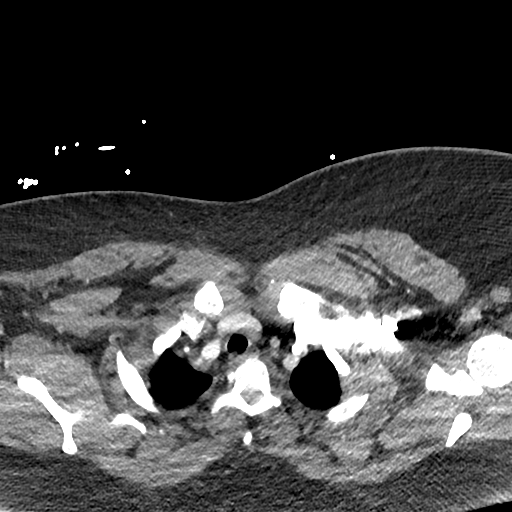
[im 313/332  lung]
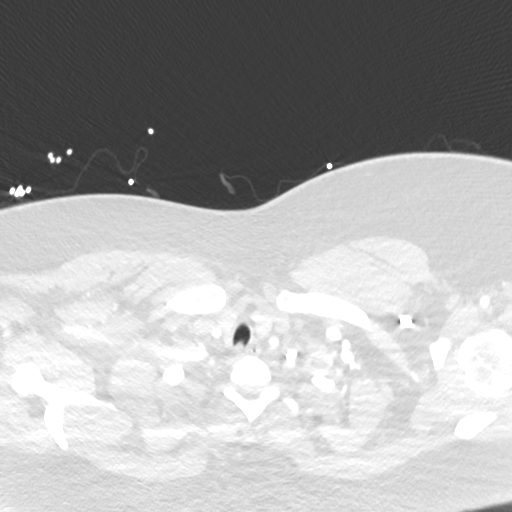

[Series 8: cor · coronal · 0.45mm/px · 3 of 143 slices shown]
[im 36/143  soft-tissue]
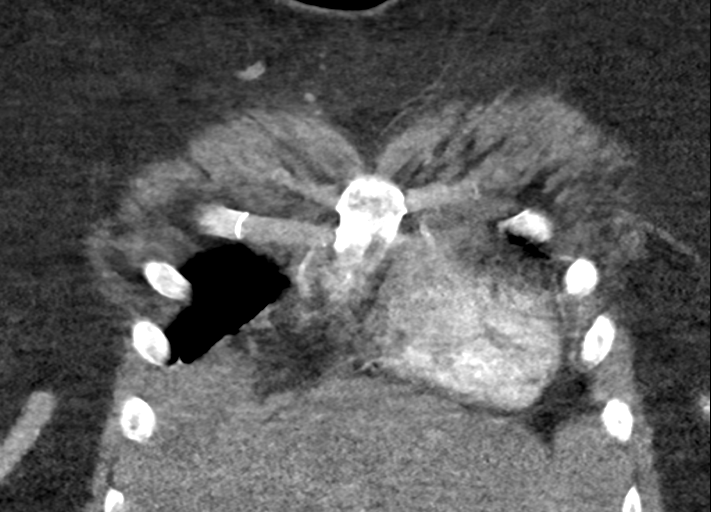
[im 72/143  soft-tissue]
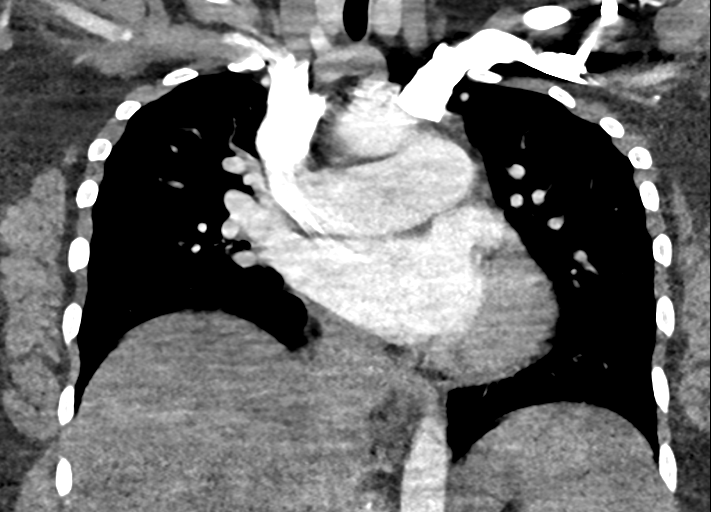
[im 107/143  soft-tissue]
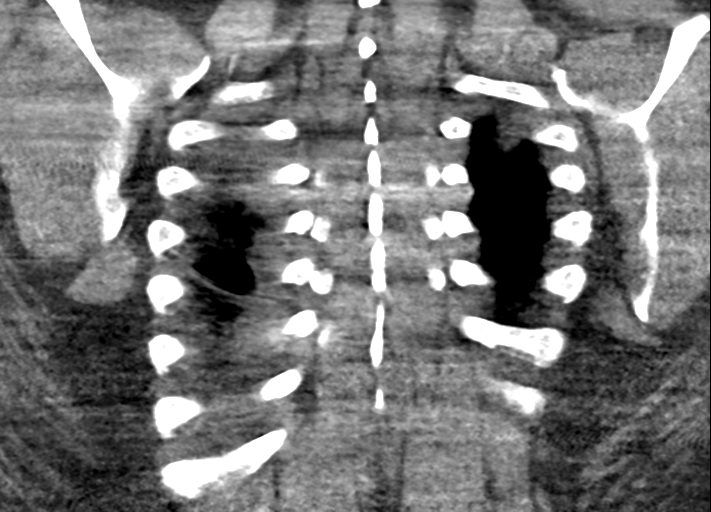

[18 of 46 positions shown; findings below may reference images not displayed]

FINDINGS: Evaluation is limited due to streak artifact caused by patient's
arms and body habitus.

Cardiovascular: Mild cardiomegaly. No pericardial effusion. The
thoracic aorta is unremarkable. The origins of the great vessels of
the aortic arch appear patent as visualized. There is dilatation of
the main pulmonary trunk suggestive of pulmonary hypertension.
Evaluation of the pulmonary arteries is very limited due to
suboptimal opacification and respiratory motion artifact as well as
streak artifact caused by body habitus. No large central pulmonary
artery embolus identified.

Mediastinum/Nodes: There is no hilar or mediastinal adenopathy. The
esophagus and the thyroid gland are grossly unremarkable. No
mediastinal fluid collection.

Lungs/Pleura: Small right pleural effusion. No focal consolidation,
or pneumothorax. The central airways are patent.

Upper Abdomen: Fatty liver.

Musculoskeletal: No chest wall abnormality. No acute or significant
osseous findings.

Review of the MIP images confirms the above findings.
IMPRESSION: 1. Very limited study for evaluation of pulmonary embolism. No large
central pulmonary artery embolus identified.
2. Small right pleural effusion.
3. Mild cardiomegaly.
4. Dilated main pulmonary trunk suggestive of pulmonary
hypertension.
5. Fatty liver.

## 2021-04-09 ENCOUNTER — Other Ambulatory Visit: Payer: Self-pay

## 2021-04-09 ENCOUNTER — Encounter (HOSPITAL_COMMUNITY): Payer: Self-pay

## 2021-04-09 ENCOUNTER — Emergency Department (HOSPITAL_COMMUNITY)
Admission: EM | Admit: 2021-04-09 | Discharge: 2021-04-09 | Disposition: A | Discharge status: Home / Self Care | Payer: PRIVATE HEALTH INSURANCE | Attending: Emergency Medicine | Admitting: Emergency Medicine

## 2021-04-09 ENCOUNTER — Emergency Department (HOSPITAL_COMMUNITY): Payer: PRIVATE HEALTH INSURANCE

## 2021-04-09 DIAGNOSIS — Z7984 Long term (current) use of oral hypoglycemic drugs: Secondary | ICD-10-CM | POA: Diagnosis not present

## 2021-04-09 DIAGNOSIS — E119 Type 2 diabetes mellitus without complications: Secondary | ICD-10-CM | POA: Diagnosis not present

## 2021-04-09 DIAGNOSIS — J45909 Unspecified asthma, uncomplicated: Secondary | ICD-10-CM | POA: Diagnosis not present

## 2021-04-09 DIAGNOSIS — M25561 Pain in right knee: Secondary | ICD-10-CM | POA: Insufficient documentation

## 2021-04-09 DIAGNOSIS — Z794 Long term (current) use of insulin: Secondary | ICD-10-CM | POA: Diagnosis not present

## 2021-04-09 DIAGNOSIS — Z8616 Personal history of COVID-19: Secondary | ICD-10-CM | POA: Diagnosis not present

## 2021-04-09 DIAGNOSIS — Z7951 Long term (current) use of inhaled steroids: Secondary | ICD-10-CM | POA: Diagnosis not present

## 2021-04-09 MED ORDER — OXYCODONE-ACETAMINOPHEN 5-325 MG PO TABS
1.0000 | ORAL_TABLET | ORAL | Status: DC | PRN
  Administered 2021-04-09: 1 via ORAL
  Filled 2021-04-09: qty 1

## 2021-04-09 NOTE — ED Provider Notes (Signed)
[facility] EMERGENCY DEPARTMENT Provider Note   CSN: [id number] Arrival date & time: 04/09/21  1401     History Chief Complaint  Patient presents with   Knee Pain    Kaitlyn Rodgers is a 45 y.o. female with a past medical history of rheumatoid arthritis presenting today with a complaint of right knee pain.  Patient reports that this morning she was sitting down and felt her right knee popped.  She says that it pops regularly however this was more intense.  When she tried to stand up she could barely bear weight.  Reports the pain radiates up her thigh.  No numbness or tingling. 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  She was prescribed meloxicam which initially helped her however no longer helps her.  Past Medical History:  Diagnosis Date   Abnormal vaginal bleeding    Anemia    Asthma    seasonal asthma-using inhaler daily at present   Complication of anesthesia    panic attack after breast biopsy 20 years ago   DM II (diabetes mellitus, type II), controlled (HCC)    metformin 500mg  qd   Fibroids    GERD (gastroesophageal reflux disease)    occasional indigestion-tums prn   Heart murmur    Rheumatoid arthritis [facility])     Patient Active Problem List   Diagnosis Date Noted   History of diabetes mellitus, type II 05/02/2019   SIRS (systemic inflammatory response syndrome) ([place]) 10/20/2018   Acute on chronic respiratory failure with hypoxia ([place]) 10/19/2018   Type 2 diabetes mellitus without complication ([place]) 41/96/2229   Rheumatoid arthritis ([place]) 10/19/2018   Pneumonia due to COVID-19 virus 10/17/2018   DUB (dysfunctional uterine bleeding) 01/24/2011   Thickened endometrium 01/24/2011    Past Surgical History:  Procedure Laterality Date   ABDOMINAL HYSTERECTOMY     BIOPSY BREAST  age 40   benign    CYSTOSCOPY  10/13/2011   Procedure: CYSTOSCOPY;  Surgeon: Emily Filbert, MD;  Location: [facility];  Service: Gynecology;  Laterality: N/A;    TUBAL LIGATION       OB History     Gravida  69   Para  4   Term  4   Preterm      AB  1   Living  4      SAB      IAB  1   Ectopic      Multiple      Live Births              Family History  Problem Relation Age of Onset   Heart disease Mother    Diabetes Maternal Grandmother    Prostate cancer Father    Healthy Sister    Healthy Brother    Healthy Brother    Healthy Son    Diabetes Daughter    Healthy Daughter    Asthma Daughter    Healthy Daughter     Social History   Tobacco Use   Smoking status: Never   Smokeless tobacco: Never  Vaping Use   Vaping Use: Never used  Substance Use Topics   Alcohol use: No   Drug use: No    Home Medications Prior to Admission medications   Medication Sig Start Date End Date Taking? Authorizing Provider  acetaminophen (TYLENOL) 500 MG tablet Take 500 mg by mouth every 6 (six) hours as needed.    [provider]  albuterol (VENTOLIN HFA)  108 (90 Base) MCG/ACT inhaler Inhale 2 puffs into the lungs every 6 (six) hours as needed for wheezing or shortness of breath. 09/13/19   Spero Geralds, MD  dapagliflozin propanediol (FARXIGA) 5 MG TABS tablet Take 5 mg by mouth daily.    [provider]  Dulaglutide (TRULICITY [place]) Inject into the skin once a week.    [provider]  fluconazole (DIFLUCAN) 150 MG tablet Take 150 mg by mouth every 3 (three) days. 09/17/19   [provider]  fluticasone (FLONASE) 50 MCG/ACT nasal spray Place 1 spray into both nostrils daily.    [provider]  gabapentin (NEURONTIN) 300 MG capsule Take 300 mg by mouth 3 (three) times daily. 100 mg in the morning 100 mg afternoon 600 mg at bedtime    [provider]  hydroxychloroquine (PLAQUENIL) 200 MG tablet Take 1 tablet (200 mg total) by mouth 2 (two) times daily. 11/22/19   Bo Merino, MD  ibuprofen (ADVIL) 200 MG tablet Take 600 mg by mouth every 6 (six) hours as needed for mild  pain.    [provider]  Insulin Glargine (BASAGLAR KWIKPEN) 100 UNIT/ML SOPN Inject 0.2 mLs (20 Units total) into the skin 2 (two) times daily. Patient taking differently: Inject 14 Units into the skin at bedtime.  10/20/18   [name], MD  metFORMIN (GLUCOPHAGE) 500 MG tablet Take 500 mg by mouth 2 (two) times daily with a meal.     [provider]  mometasone-formoterol (DULERA) 100-5 MCG/ACT AERO Inhale 2 puffs into the lungs in the morning and at bedtime. 09/13/19   Spero Geralds, MD  montelukast (SINGULAIR) 10 MG tablet Take 10 mg by mouth daily.    [provider]  pantoprazole (PROTONIX) 40 MG tablet Take 1 tablet (40 mg total) by mouth daily. 09/13/19   Spero Geralds, MD  pravastatin (PRAVACHOL) 40 MG tablet Take 40 mg by mouth daily.    [provider]  TRULICITY 1.5 MW/4.1LK SOPN SMARTSIG:0.5 Milliliter(s) SUB-Q Once a Week 09/12/19   [provider]  budesonide-formoterol (SYMBICORT) 160-4.5 MCG/ACT inhaler Inhale 2 puffs into the lungs 2 (two) times daily.  09/13/19  [provider]    Allergies    Patient has no known allergies.  Review of Systems   Review of Systems  Musculoskeletal:  Positive for arthralgias, gait problem and joint swelling.  Skin:  Negative for wound.  Neurological:  Negative for weakness and numbness.   Physical Exam Updated Vital Signs BP 131/84 (BP Location: Right Arm)   Pulse 87   Temp 98.3 F (36.8 C) (Oral)   Resp 18   LMP 07/09/2011   SpO2 99%   Physical Exam Vitals and nursing note reviewed.  Constitutional:      Appearance: Normal appearance.  HENT:     Head: Normocephalic and atraumatic.  Eyes:     General: No scleral icterus.    Conjunctiva/sclera: Conjunctivae normal.  Pulmonary:     Effort: Pulmonary effort is normal. No respiratory distress.  Musculoskeletal:        General: Tenderness (To anterior portion of right knee.) present. No swelling, deformity or signs  of injury. Normal range of motion.     Comments: Patient with full range of motion of affected knee.  Ligamental test negative.  Skin:    General: Skin is warm and dry.     Capillary Refill: Capillary refill takes less than 2 seconds.     Findings: No rash.  Neurological:     Mental Status: She is alert.  Psychiatric:        Mood and Affect: Mood normal.        Behavior: Behavior normal.    ED Results / Procedures / Treatments   Labs (all labs ordered are listed, but only abnormal results are displayed) Labs Reviewed - No data to display  EKG None  Radiology DG Knee Complete 4 Views Right  Result Date: 04/09/2021 CLINICAL DATA:  Right knee pain. Patient was sitting down and heard a pop EXAM: RIGHT KNEE - COMPLETE 4+ VIEW COMPARISON:  None. FINDINGS: No evidence of fracture, dislocation, or joint effusion. No evidence of arthropathy or other focal bone abnormality. Soft tissues are unremarkable. IMPRESSION: Negative. Electronically Signed   By: Davina Poke D.O.   On: 04/09/2021 16:29    Procedures Procedures   Medications Ordered in ED Medications  oxyCODONE-acetaminophen (PERCOCET/ROXICET) 5-325 MG per tablet 1 tablet (1 tablet Oral Given 04/09/21 1528)    ED Course  I have reviewed the triage vital signs and the nursing notes.  Pertinent labs & imaging results that were available during my care of the patient were reviewed by me and considered in my medical decision making (see chart for details).   MDM Rules/Calculators/A&P Patient was evaluated by me.  She was with difficulty bearing weight on her right knee.  Chest x-ray was negative and ligamental exams unrevealing.  I suspect the patient to have a meniscus injury.  Potentially due to her RA, potentially due to her large body habitus.  Patient has previously seen [facility] and they recommended that they do an MRI of the joint.  She did not do so due to financial concerns.  I informed the patient that we would not  be able to do an MRI today however I would like her to follow-up with [facility].  She has been using meloxicam which she reports use to help her however it does not help her anymore.  Lidocaine patches have also not helped.  She already has a knee brace at this time so I will give her crutches and a referral back to [facility].  She is understanding of this plan.  I have also provided a work note to limit her physical activity at work.  Ambulatory and stable for discharge.   Final Clinical Impression(s) / ED Diagnoses Final diagnoses:  Acute pain of right knee    Rx / DC Orders Results and diagnoses were explained to the patient. Return precautions discussed in full. Patient had no additional questions and expressed complete understanding.     Darliss Ridgel 04/09/21 1915    Carmin Muskrat, MD 04/09/21 2203

## 2021-04-09 NOTE — ED Triage Notes (Signed)
Pt reports she heard a "pop" in her right knee today followed by significant pain. Hx of RA, thinks it flared up about 4 days ago. Unable to bare weight on that knee due to the pain.

## 2021-04-09 NOTE — Discharge Instructions (Addendum)
Your x-rays look good today.  There are no signs of fracture.  I believe you do need an MRI as we discussed.  Please follow-up with orthopedics to schedule this.  I am attaching a work note for you.  It was a pleasure to meet you and I hope that you feel better!

## 2021-04-11 NOTE — Progress Notes (Deleted)
Office Visit Note  Patient: Kaitlyn Rodgers             Date of Birth: 09-Jul-1975           MRN: [id number]             PCP: Hayden Rasmussen, MD Referring: Hayden Rasmussen, MD Visit Date: 04/25/2021 Occupation: @GUAROCC @  Subjective:  No chief complaint on file.   History of Present Illness: Kaitlyn Rodgers is a 45 y.o. female ***   Activities of Daily Living:  Patient reports morning stiffness for *** {minute/hour:19697}.   Patient {ACTIONS;DENIES/REPORTS:21021675::"Denies"} nocturnal pain.  Difficulty dressing/grooming: {ACTIONS;DENIES/REPORTS:21021675::"Denies"} Difficulty climbing stairs: {ACTIONS;DENIES/REPORTS:21021675::"Denies"} Difficulty getting out of chair: {ACTIONS;DENIES/REPORTS:21021675::"Denies"} Difficulty using hands for taps, buttons, cutlery, and/or writing: {ACTIONS;DENIES/REPORTS:21021675::"Denies"}  No Rheumatology ROS completed.   PMFS History:  Patient Active Problem List   Diagnosis Date Noted  . History of diabetes mellitus, type II 05/02/2019  . SIRS (systemic inflammatory response syndrome) ([place]) 10/20/2018  . Acute on chronic respiratory failure with hypoxia ([place]) 10/19/2018  . Type 2 diabetes mellitus without complication ([place]) 30/02/2329  . Rheumatoid arthritis ([place]) 10/19/2018  . Pneumonia due to COVID-19 virus 10/17/2018  . DUB (dysfunctional uterine bleeding) 01/24/2011  . Thickened endometrium 01/24/2011    Past Medical History:  Diagnosis Date  . Abnormal vaginal bleeding   . Anemia   . Asthma    seasonal asthma-using inhaler daily at present  . Complication of anesthesia    panic attack after breast biopsy 20 years ago  . DM II (diabetes mellitus, type II), controlled ([place])    metformin 500mg  qd  . Fibroids   . GERD (gastroesophageal reflux disease)    occasional indigestion-tums prn  . Heart murmur   . Rheumatoid arthritis ([place])     Family History  Problem Relation Age of Onset  . Heart disease Mother   . Diabetes  Maternal Grandmother   . Prostate cancer Father   . Healthy Sister   . Healthy Brother   . Healthy Brother   . Healthy Son   . Diabetes Daughter   . Healthy Daughter   . Asthma Daughter   . Healthy Daughter    Past Surgical History:  Procedure Laterality Date  . ABDOMINAL HYSTERECTOMY    . BIOPSY BREAST  age 71   benign   . CYSTOSCOPY  10/13/2011   Procedure: CYSTOSCOPY;  Surgeon: Emily Filbert, MD;  Location: [facility];  Service: Gynecology;  Laterality: N/A;  . TUBAL LIGATION     Social History   Social History Narrative  . Not on file   Immunization History  Administered Date(s) Administered  . Influenza Split 08/22/2011  . Influenza,inj,Quad PF,6+ Mos 03/24/2019  . PFIZER(Purple Top)SARS-COV-2 Vaccination 08/17/2019, 09/14/2019  . Pneumococcal Polysaccharide-23 10/13/2011     Objective: Vital Signs: LMP 07/09/2011    Physical Exam   Musculoskeletal Exam: ***  CDAI Exam: CDAI Score: -- Patient Global: --; Provider Global: -- Swollen: --; Tender: -- Joint Exam 04/25/2021   No joint exam has been documented for this visit   There is currently no information documented on the homunculus. Go to the Rheumatology activity and complete the homunculus joint exam.  Investigation: No additional findings.  Imaging: DG Knee Complete 4 Views Right  Result Date: 04/09/2021 CLINICAL DATA:  Right knee pain. Patient was sitting down and heard a pop EXAM: RIGHT KNEE - COMPLETE 4+ VIEW COMPARISON:  None. FINDINGS: No evidence of fracture, dislocation, or joint effusion. No evidence of arthropathy  or other focal bone abnormality. Soft tissues are unremarkable. IMPRESSION: Negative. Electronically Signed   By: Davina Poke D.O.   On: 04/09/2021 16:29    Recent Labs: Lab Results  Component Value Date   WBC 8.1 08/28/2019   HGB 12.4 08/28/2019   PLT 435 (H) 08/28/2019   NA 139 08/28/2019   K 4.5 08/28/2019   CL 101 08/28/2019   CO2 29 08/28/2019   GLUCOSE 130 (H)  08/28/2019   BUN 14 08/28/2019   CREATININE 0.58 08/28/2019   BILITOT 0.6 08/28/2019   ALKPHOS 59 08/28/2019   AST 19 08/28/2019   ALT 21 08/28/2019   PROT 7.7 08/28/2019   ALBUMIN 3.5 08/28/2019   CALCIUM 9.7 08/28/2019   GFRAA >60 08/28/2019    Speciality Comments: PLQ Eye Exam: 07/15/19 WNL with Dr Delman Cheadle follow up in 1 year   Procedures:  No procedures performed Allergies: Patient has no known allergies.   Assessment / Plan:     Visit Diagnoses: No diagnosis found.  Orders: No orders of the defined types were placed in this encounter.  No orders of the defined types were placed in this encounter.   Face-to-face time spent with patient was *** minutes. Greater than 50% of time was spent in counseling and coordination of care.  Follow-Up Instructions: No follow-ups on file.   Earnestine Mealing, CMA  Note - This record has been created using Editor, commissioning.  Chart creation errors have been sought, but may not always  have been located. Such creation errors do not reflect on  the standard of medical care.

## 2021-04-24 ENCOUNTER — Ambulatory Visit (INDEPENDENT_AMBULATORY_CARE_PROVIDER_SITE_OTHER): Payer: No Typology Code available for payment source | Admitting: Rheumatology

## 2021-04-24 ENCOUNTER — Other Ambulatory Visit: Payer: Self-pay

## 2021-04-24 ENCOUNTER — Encounter: Payer: Self-pay | Admitting: Rheumatology

## 2021-04-24 VITALS — BP 127/89 | HR 93 | Ht 64.0 in | Wt 326.0 lb

## 2021-04-24 DIAGNOSIS — Z794 Long term (current) use of insulin: Secondary | ICD-10-CM

## 2021-04-24 DIAGNOSIS — U071 COVID-19: Secondary | ICD-10-CM

## 2021-04-24 DIAGNOSIS — M2141 Flat foot [pes planus] (acquired), right foot: Secondary | ICD-10-CM

## 2021-04-24 DIAGNOSIS — Z79899 Other long term (current) drug therapy: Secondary | ICD-10-CM

## 2021-04-24 DIAGNOSIS — M79641 Pain in right hand: Secondary | ICD-10-CM

## 2021-04-24 DIAGNOSIS — M25561 Pain in right knee: Secondary | ICD-10-CM

## 2021-04-24 DIAGNOSIS — J1282 Pneumonia due to coronavirus disease 2019: Secondary | ICD-10-CM

## 2021-04-24 DIAGNOSIS — M0609 Rheumatoid arthritis without rheumatoid factor, multiple sites: Secondary | ICD-10-CM

## 2021-04-24 DIAGNOSIS — G8929 Other chronic pain: Secondary | ICD-10-CM

## 2021-04-24 DIAGNOSIS — M19072 Primary osteoarthritis, left ankle and foot: Secondary | ICD-10-CM

## 2021-04-24 DIAGNOSIS — M722 Plantar fascial fibromatosis: Secondary | ICD-10-CM

## 2021-04-24 DIAGNOSIS — M19071 Primary osteoarthritis, right ankle and foot: Secondary | ICD-10-CM

## 2021-04-24 DIAGNOSIS — M79642 Pain in left hand: Secondary | ICD-10-CM

## 2021-04-24 DIAGNOSIS — M2142 Flat foot [pes planus] (acquired), left foot: Secondary | ICD-10-CM

## 2021-04-24 DIAGNOSIS — Z8269 Family history of other diseases of the musculoskeletal system and connective tissue: Secondary | ICD-10-CM

## 2021-04-24 DIAGNOSIS — E119 Type 2 diabetes mellitus without complications: Secondary | ICD-10-CM

## 2021-04-24 NOTE — Progress Notes (Addendum)
Office Visit Note  Patient: Kaitlyn Rodgers             Date of Birth: 02-08-1976           MRN: [id number]             PCP: Hayden Rasmussen, MD Referring: Hayden Rasmussen, MD Visit Date: 04/24/2021 Occupation: @GUAROCC @  Subjective:  Right knee and right wrist pain .   History of Present Illness: Birgit Mccreadie is a 45 y.o. female with a history of seronegative rheumatoid arthritis and osteoarthritis.  She returns today after her last visit in March 2021.  She had no synovitis at the last visit on ultrasound examination in April 2021.  She decided to stop hydroxychloroquine due to financial reasons.  She has been off the medication since then.  She states in June 2022 she started having pain and swelling in her right knee joint.  She also continues to have  intermittent swelling in her right wrist joint.  Patient states that she was evaluated by her PCP and was referred to [facility].  She had x-ray of her knee joint and MRI of the knee joint was ordered.  She did not get the MRI due to financial reasons.  She states on October 19 she felt a sudden pop in her right knee joint with severe pain and discomfort.  She was evaluated in the emergency room where she had another x-ray.  She was advised to follow-up with [facility].  MRIs are scheduled for tomorrow.  She states Dr. Darron Doom placed her on hydroxychloroquine 200 mg 1 tablet daily and meloxicam and October for ongoing inflammation in her joints.  She said the meloxicam initially helped but then it quit working when she went back to work.  She has been using a brace with that she is able to walk only for short time.  She continues to have a stiffness and discomfort in her right wrist joint.  None of the other joints are painful.  He has history of hyperalgesia.  Activities of Daily Living:  Patient reports morning stiffness for 7-10 minutes.   Patient Reports nocturnal pain.  Difficulty dressing/grooming: Reports Difficulty climbing  stairs: Reports Difficulty getting out of chair: Reports Difficulty using hands for taps, buttons, cutlery, and/or writing: Reports  Review of Systems  Constitutional:  Positive for fatigue.  HENT:  Positive for mouth dryness. Negative for mouth sores and nose dryness.   Eyes:  Positive for dryness. Negative for pain and itching.  Respiratory:  Negative for shortness of breath and difficulty breathing.   Cardiovascular:  Negative for chest pain and palpitations.  Gastrointestinal:  Negative for constipation and diarrhea.  Endocrine: Negative for increased urination.  Genitourinary:  Negative for difficulty urinating.  Musculoskeletal:  Positive for joint pain, joint pain, myalgias, morning stiffness, muscle tenderness and myalgias. Negative for joint swelling.  Skin:  Negative for color change, rash, redness and sensitivity to sunlight.  Allergic/Immunologic: Positive for susceptible to infections.  Neurological:  Positive for dizziness, numbness and weakness. Negative for headaches and memory loss.  Hematological:  Negative for bruising/bleeding tendency.  Psychiatric/Behavioral:  Negative for confusion.    PMFS History:  Patient Active Problem List   Diagnosis Date Noted   History of diabetes mellitus, type II 05/02/2019   SIRS (systemic inflammatory response syndrome) ([place]) 10/20/2018   Acute on chronic respiratory failure with hypoxia ([place]) 10/19/2018   Type 2 diabetes mellitus without complication ([place]) 89/16/9450   Rheumatoid arthritis ([place]) 10/19/2018  Pneumonia due to COVID-19 virus 10/17/2018   DUB (dysfunctional uterine bleeding) 01/24/2011   Thickened endometrium 01/24/2011    Past Medical History:  Diagnosis Date   Abnormal vaginal bleeding    Anemia    Asthma    seasonal asthma-using inhaler daily at present   Complication of anesthesia    panic attack after breast biopsy 20 years ago   DM II (diabetes mellitus, type II), controlled (HCC)    metformin 500mg  qd    Fibroids    GERD (gastroesophageal reflux disease)    occasional indigestion-tums prn   Heart murmur    Rheumatoid arthritis ([place])     Family History  Problem Relation Age of Onset   Heart disease Mother    Diabetes Maternal Grandmother    Prostate cancer Father    Healthy Sister    Healthy Brother    Healthy Brother    Healthy Son    Diabetes Daughter    Healthy Daughter    Asthma Daughter    Healthy Daughter    Past Surgical History:  Procedure Laterality Date   ABDOMINAL HYSTERECTOMY     BIOPSY BREAST  age 39   benign    CYSTOSCOPY  10/13/2011   Procedure: CYSTOSCOPY;  Surgeon: Emily Filbert, MD;  Location: [facility];  Service: Gynecology;  Laterality: N/A;   TUBAL LIGATION     Social History   Social History Narrative   Not on file   Immunization History  Administered Date(s) Administered   Influenza Split 08/22/2011   Influenza,inj,Quad PF,6+ Mos 03/24/2019   PFIZER(Purple Top)SARS-COV-2 Vaccination 08/17/2019, 09/14/2019, 07/06/2020   Pfizer Covid-19 Vaccine Bivalent Booster 32yrs & up 03/12/2021   Pneumococcal Polysaccharide-23 10/13/2011     Objective: Vital Signs: BP 127/89 (BP Location: Left Wrist, Patient Position: Sitting, Cuff Size: Normal)   Pulse 93   Ht 5\' 4"  (1.626 m)   Wt (!) 326 lb (147.9 kg)   LMP 07/09/2011   BMI 55.96 kg/m    Physical Exam Vitals and nursing note reviewed.  Constitutional:      Appearance: She is well-developed.  HENT:     Head: Normocephalic and atraumatic.  Eyes:     Conjunctiva/sclera: Conjunctivae normal.  Cardiovascular:     Rate and Rhythm: Normal rate and regular rhythm.     Heart sounds: Normal heart sounds.  Pulmonary:     Effort: Pulmonary effort is normal.     Breath sounds: Normal breath sounds.  Abdominal:     General: Bowel sounds are normal.     Palpations: Abdomen is soft.  Musculoskeletal:     Cervical back: Normal range of motion.  Lymphadenopathy:     Cervical: No cervical adenopathy.   Skin:    General: Skin is warm and dry.     Capillary Refill: Capillary refill takes less than 2 seconds.  Neurological:     Mental Status: She is alert and oriented to person, place, and time.  Psychiatric:        Behavior: Behavior normal.     Musculoskeletal Exam: C-spine was in good range of motion.  Shoulder joints, elbow joints, wrist joints, MCPs PIPs and DIPs with good range of motion with no synovitis.  She had tenderness on palpation of the right wrist joint.  Hip joints in good range of motion.  She had painful range of motion of her right knee joint without any warmth swelling or effusion.  Left knee joint was in good range of motion.  There was  no tenderness over ankles or MTPs.  CDAI Exam: CDAI Score: 3  Patient Global: 5 mm; Provider Global: 5 mm Swollen: 0 ; Tender: 2  Joint Exam 04/24/2021      Right  Left  Wrist   Tender     Knee   Tender        Investigation: No additional findings.  Imaging: DG Knee Complete 4 Views Right  Result Date: 04/09/2021 CLINICAL DATA:  Right knee pain. Patient was sitting down and heard a pop EXAM: RIGHT KNEE - COMPLETE 4+ VIEW COMPARISON:  None. FINDINGS: No evidence of fracture, dislocation, or joint effusion. No evidence of arthropathy or other focal bone abnormality. Soft tissues are unremarkable. IMPRESSION: Negative. Electronically Signed   By: Davina Poke D.O.   On: 04/09/2021 16:29    Recent Labs: Lab Results  Component Value Date   WBC 8.1 08/28/2019   HGB 12.4 08/28/2019   PLT 435 (H) 08/28/2019   NA 139 08/28/2019   K 4.5 08/28/2019   CL 101 08/28/2019   CO2 29 08/28/2019   GLUCOSE 130 (H) 08/28/2019   BUN 14 08/28/2019   CREATININE 0.58 08/28/2019   BILITOT 0.6 08/28/2019   ALKPHOS 59 08/28/2019   AST 19 08/28/2019   ALT 21 08/28/2019   PROT 7.7 08/28/2019   ALBUMIN 3.5 08/28/2019   CALCIUM 9.7 08/28/2019   GFRAA >60 08/28/2019    Speciality Comments: PLQ Eye Exam: 07/15/19 WNL with Dr Delman Cheadle follow  up in 1 year  PLQ12/2020-08/2019  Procedures:  No procedures performed Allergies: Patient has no known allergies.   Assessment / Plan:     Visit Diagnoses: Rheumatoid arthritis of multiple sites with negative rheumatoid factor ([place]) -she was diagnosed with rheumatoid arthritis in [place] in 2010.  She was evaluated by [place] in 2020.  Due to ongoing pain and discomfort she was placed on hydroxychloroquine in December  2020.  She took it for 4 months and then discontinued due to financial reasons.  She had no synovitis on the ultrasound examination performed in April 2021.  She has been having increased pain and discomfort in her right wrist and right knee joint.  She states right knee joint pain got worse after a sudden pop in her right knee joint and October 2022.  She has been having difficulty walking.  She is scheduled to have MRI of her right knee joint.  I do not see any synovitis on the examination.  I will obtain following labs today.  She was placed on hydroxychloroquine 1 tablet p.o. daily by Dr. Darron Doom in October.  She is also been taking meloxicam.  Plan: Sedimentation rate, Rheumatoid factor, Cyclic citrul peptide antibody, IgG  High risk medication use - PLQ 200 mg 1 tablet by mouth twice daily from December 2020 till March 2021, restarted Plaquenil 200 mg p.o. daily in October by Dr. Darron Doom.  - Plan: CBC with Differential/Platelet, COMPLETE METABOLIC PANEL WITH GFR  Pain in both hands-she complains of discomfort in her hands especially her right wrist joint.  No synovitis was noted.  Chronic pain of right knee-she has been having severe pain and discomfort in her right knee joint and difficulty walking despite wearing a brace.  She is scheduled to have MRI of her right knee joint at [facility].  She felt a sudden pop in her right knee joint in October and was seen in the emergency room.  She has been taking meloxicam which helps her to some extent.  I advised her  to bring the results of  her MRI at the follow-up visit.  Primary osteoarthritis of both feet-she denies any discomfort in her feet.  Plantar fascial fibromatosis  Pes planus of both feet  Type 2 diabetes mellitus without complication, with long-term current use of insulin (HCC)  Family history of systemic lupus erythematosus  Pneumonia due to COVID-19 virus - Resolved   Orders: Orders Placed This Encounter  Procedures   CBC with Differential/Platelet   COMPLETE METABOLIC PANEL WITH GFR   Sedimentation rate   Rheumatoid factor   Cyclic citrul peptide antibody, IgG    No orders of the defined types were placed in this encounter.   Face-to-face time spent with patient was 30 minutes. Greater than 50% of time was spent in counseling and coordination of care.  Follow-Up Instructions: Return in about 6 weeks (around 06/05/2021) for Rheumatoid arthritis, Osteoarthritis.  All prior records are reviewed.   Bo Merino, MD  Note - This record has been created using Editor, commissioning.  Chart creation errors have been sought, but may not always  have been located. Such creation errors do not reflect on  the standard of medical care.

## 2021-04-25 ENCOUNTER — Ambulatory Visit: Payer: PRIVATE HEALTH INSURANCE | Admitting: Rheumatology

## 2021-04-25 DIAGNOSIS — Z8269 Family history of other diseases of the musculoskeletal system and connective tissue: Secondary | ICD-10-CM

## 2021-04-25 DIAGNOSIS — G8929 Other chronic pain: Secondary | ICD-10-CM

## 2021-04-25 DIAGNOSIS — M19071 Primary osteoarthritis, right ankle and foot: Secondary | ICD-10-CM

## 2021-04-25 DIAGNOSIS — M0609 Rheumatoid arthritis without rheumatoid factor, multiple sites: Secondary | ICD-10-CM

## 2021-04-25 DIAGNOSIS — U071 COVID-19: Secondary | ICD-10-CM

## 2021-04-25 DIAGNOSIS — M2141 Flat foot [pes planus] (acquired), right foot: Secondary | ICD-10-CM

## 2021-04-25 DIAGNOSIS — Z794 Long term (current) use of insulin: Secondary | ICD-10-CM

## 2021-04-25 DIAGNOSIS — M722 Plantar fascial fibromatosis: Secondary | ICD-10-CM

## 2021-04-25 DIAGNOSIS — Z79899 Other long term (current) drug therapy: Secondary | ICD-10-CM

## 2021-04-25 LAB — CBC WITH DIFFERENTIAL/PLATELET
Absolute Monocytes: 428 cells/uL (ref 200–950)
Basophils Absolute: 34 cells/uL (ref 0–200)
Basophils Relative: 0.4 %
Eosinophils Absolute: 218 cells/uL (ref 15–500)
Eosinophils Relative: 2.6 %
HCT: 40.7 % (ref 35.0–45.0)
Hemoglobin: 13.2 g/dL (ref 11.7–15.5)
Lymphs Abs: 3091 cells/uL (ref 850–3900)
MCH: 27.8 pg (ref 27.0–33.0)
MCHC: 32.4 g/dL (ref 32.0–36.0)
MCV: 85.9 fL (ref 80.0–100.0)
MPV: 9.3 fL (ref 7.5–12.5)
Monocytes Relative: 5.1 %
Neutro Abs: 4628 cells/uL (ref 1500–7800)
Neutrophils Relative %: 55.1 %
Platelets: 429 10*3/uL — ABNORMAL HIGH (ref 140–400)
RBC: 4.74 10*6/uL (ref 3.80–5.10)
RDW: 12.5 % (ref 11.0–15.0)
Total Lymphocyte: 36.8 %
WBC: 8.4 10*3/uL (ref 3.8–10.8)

## 2021-04-25 LAB — COMPLETE METABOLIC PANEL WITH GFR
AG Ratio: 1.2 (calc) (ref 1.0–2.5)
ALT: 18 U/L (ref 6–29)
AST: 15 U/L (ref 10–35)
Albumin: 4.2 g/dL (ref 3.6–5.1)
Alkaline phosphatase (APISO): 60 U/L (ref 31–125)
BUN: 17 mg/dL (ref 7–25)
CO2: 29 mmol/L (ref 20–32)
Calcium: 10 mg/dL (ref 8.6–10.2)
Chloride: 100 mmol/L (ref 98–110)
Creat: 0.72 mg/dL (ref 0.50–0.99)
Globulin: 3.4 g/dL (calc) (ref 1.9–3.7)
Glucose, Bld: 115 mg/dL — ABNORMAL HIGH (ref 65–99)
Potassium: 4.4 mmol/L (ref 3.5–5.3)
Sodium: 140 mmol/L (ref 135–146)
Total Bilirubin: 0.3 mg/dL (ref 0.2–1.2)
Total Protein: 7.6 g/dL (ref 6.1–8.1)
eGFR: 105 mL/min/{1.73_m2} (ref >=60)

## 2021-04-25 LAB — RHEUMATOID FACTOR: Rheumatoid fact SerPl-aCnc: <14 IU/mL (ref <14)

## 2021-04-25 LAB — CYCLIC CITRUL PEPTIDE ANTIBODY, IGG: Cyclic Citrullin Peptide Ab: 139 UNITS — ABNORMAL HIGH

## 2021-04-25 LAB — SEDIMENTATION RATE: Sed Rate: 43 mm/h — ABNORMAL HIGH (ref 0–20)

## 2021-04-25 NOTE — Progress Notes (Signed)
Platelets are mildly elevated and stable.  Glucose is mildly elevated, probably not a fasting sample.  Sed rate is elevated which indicates active disease.  Anti-CCP is positive consistent with rheumatoid arthritis.  Rheumatoid factor is negative.

## 2021-05-13 ENCOUNTER — Encounter: Payer: No Typology Code available for payment source | Attending: Family Medicine | Admitting: Dietician

## 2021-05-22 NOTE — Progress Notes (Deleted)
Office Visit Note  Patient: Kaitlyn Rodgers             Date of Birth: 18-Feb-1976           MRN: [id number]             PCP: Hayden Rasmussen, MD Referring: Hayden Rasmussen, MD Visit Date: 06/05/2021 Occupation: @GUAROCC @  Subjective:    History of Present Illness: Danea Manter is a 45 y.o. female with history of seronegative rheumatoid arthritis and osteoarthritis.  She is currently taking plaquenil 200 mg 1 tablet by mouth daily.   PLQ Eye Exam: 07/15/19 WNL with Dr Delman Cheadle follow up in 1 year. Due to update plaquenil eye examination.  CBC and CMP updated on 04/24/21.  Activities of Daily Living:  Patient reports morning stiffness for *** {minute/hour:19697}.   Patient {ACTIONS;DENIES/REPORTS:21021675::"Denies"} nocturnal pain.  Difficulty dressing/grooming: {ACTIONS;DENIES/REPORTS:21021675::"Denies"} Difficulty climbing stairs: {ACTIONS;DENIES/REPORTS:21021675::"Denies"} Difficulty getting out of chair: {ACTIONS;DENIES/REPORTS:21021675::"Denies"} Difficulty using hands for taps, buttons, cutlery, and/or writing: {ACTIONS;DENIES/REPORTS:21021675::"Denies"}  No Rheumatology ROS completed.   PMFS History:  Patient Active Problem List   Diagnosis Date Noted   History of diabetes mellitus, type II 05/02/2019   SIRS (systemic inflammatory response syndrome) ([place]) 10/20/2018   Acute on chronic respiratory failure with hypoxia ([place]) 10/19/2018   Type 2 diabetes mellitus without complication ([place]) 32/35/5732   Rheumatoid arthritis ([place]) 10/19/2018   Pneumonia due to COVID-19 virus 10/17/2018   DUB (dysfunctional uterine bleeding) 01/24/2011   Thickened endometrium 01/24/2011    Past Medical History:  Diagnosis Date   Abnormal vaginal bleeding    Anemia    Asthma    seasonal asthma-using inhaler daily at present   Complication of anesthesia    panic attack after breast biopsy 20 years ago   DM II (diabetes mellitus, type II), controlled (HCC)    metformin 500mg  qd    Fibroids    GERD (gastroesophageal reflux disease)    occasional indigestion-tums prn   Heart murmur    Rheumatoid arthritis ([place])     Family History  Problem Relation Age of Onset   Heart disease Mother    Diabetes Maternal Grandmother    Prostate cancer Father    Healthy Sister    Healthy Brother    Healthy Brother    Healthy Son    Diabetes Daughter    Healthy Daughter    Asthma Daughter    Healthy Daughter    Past Surgical History:  Procedure Laterality Date   ABDOMINAL HYSTERECTOMY     BIOPSY BREAST  age 5   benign    CYSTOSCOPY  10/13/2011   Procedure: CYSTOSCOPY;  Surgeon: Emily Filbert, MD;  Location: [facility];  Service: Gynecology;  Laterality: N/A;   TUBAL LIGATION     Social History   Social History Narrative   Not on file   Immunization History  Administered Date(s) Administered   Influenza Split 08/22/2011   Influenza,inj,Quad PF,6+ Mos 03/24/2019   PFIZER(Purple Top)SARS-COV-2 Vaccination 08/17/2019, 09/14/2019, 07/06/2020   Pfizer Covid-19 Vaccine Bivalent Booster 38yrs & up 03/12/2021   Pneumococcal Polysaccharide-23 10/13/2011     Objective: Vital Signs: LMP 07/09/2011    Physical Exam Vitals and nursing note reviewed.  Constitutional:      Appearance: She is well-developed.  HENT:     Head: Normocephalic and atraumatic.  Eyes:     Conjunctiva/sclera: Conjunctivae normal.  Pulmonary:     Effort: Pulmonary effort is normal.  Abdominal:     General: Bowel sounds are normal.  Palpations: Abdomen is soft.  Musculoskeletal:     Cervical back: Normal range of motion.  Skin:    General: Skin is warm and dry.     Capillary Refill: Capillary refill takes less than 2 seconds.  Neurological:     Mental Status: She is alert and oriented to person, place, and time.  Psychiatric:        Behavior: Behavior normal.     Musculoskeletal Exam: ***  CDAI Exam: CDAI Score: -- Patient Global: --; Provider Global: -- Swollen: --; Tender: -- Joint  Exam 06/05/2021   No joint exam has been documented for this visit   There is currently no information documented on the homunculus. Go to the Rheumatology activity and complete the homunculus joint exam.  Investigation: No additional findings.  Imaging: No results found.  Recent Labs: Lab Results  Component Value Date   WBC 8.4 04/24/2021   HGB 13.2 04/24/2021   PLT 429 (H) 04/24/2021   NA 140 04/24/2021   K 4.4 04/24/2021   CL 100 04/24/2021   CO2 29 04/24/2021   GLUCOSE 115 (H) 04/24/2021   BUN 17 04/24/2021   CREATININE 0.72 04/24/2021   BILITOT 0.3 04/24/2021   ALKPHOS 59 08/28/2019   AST 15 04/24/2021   ALT 18 04/24/2021   PROT 7.6 04/24/2021   ALBUMIN 3.5 08/28/2019   CALCIUM 10.0 04/24/2021   GFRAA >60 08/28/2019    Speciality Comments: PLQ Eye Exam: 07/15/19 WNL with Dr Delman Cheadle follow up in 1 year  PLQ12/2020-08/2019    Dxd with RA in [place]2010  Procedures:  No procedures performed Allergies: Patient has no known allergies.   Assessment / Plan:     Visit Diagnoses: No diagnosis found.  Orders: No orders of the defined types were placed in this encounter.  No orders of the defined types were placed in this encounter.   Face-to-face time spent with patient was *** minutes. Greater than 50% of time was spent in counseling and coordination of care.  Follow-Up Instructions: No follow-ups on file.   Earnestine Mealing, CMA  Note - This record has been created using Editor, commissioning.  Chart creation errors have been sought, but may not always  have been located. Such creation errors do not reflect on  the standard of medical care.

## 2021-06-05 ENCOUNTER — Ambulatory Visit: Payer: No Typology Code available for payment source | Admitting: Physician Assistant

## 2021-06-05 DIAGNOSIS — M722 Plantar fascial fibromatosis: Secondary | ICD-10-CM

## 2021-06-05 DIAGNOSIS — G8929 Other chronic pain: Secondary | ICD-10-CM

## 2021-06-05 DIAGNOSIS — Z8269 Family history of other diseases of the musculoskeletal system and connective tissue: Secondary | ICD-10-CM

## 2021-06-05 DIAGNOSIS — U071 COVID-19: Secondary | ICD-10-CM

## 2021-06-05 DIAGNOSIS — M19071 Primary osteoarthritis, right ankle and foot: Secondary | ICD-10-CM

## 2021-06-05 DIAGNOSIS — M0609 Rheumatoid arthritis without rheumatoid factor, multiple sites: Secondary | ICD-10-CM

## 2021-06-05 DIAGNOSIS — E119 Type 2 diabetes mellitus without complications: Secondary | ICD-10-CM

## 2021-06-05 DIAGNOSIS — Z79899 Other long term (current) drug therapy: Secondary | ICD-10-CM

## 2021-06-05 DIAGNOSIS — M79642 Pain in left hand: Secondary | ICD-10-CM

## 2021-06-05 DIAGNOSIS — M2141 Flat foot [pes planus] (acquired), right foot: Secondary | ICD-10-CM

## 2024-03-31 NOTE — Progress Notes (Signed)
 Clinical support only

## 2024-05-27 ENCOUNTER — Ambulatory Visit (HOSPITAL_COMMUNITY)
Admission: EM | Admit: 2024-05-27 | Discharge: 2024-05-27 | Disposition: A | Discharge status: Home / Self Care | Attending: Emergency Medicine | Admitting: Emergency Medicine

## 2024-05-27 ENCOUNTER — Encounter (HOSPITAL_COMMUNITY): Payer: Self-pay

## 2024-05-27 ENCOUNTER — Ambulatory Visit (INDEPENDENT_AMBULATORY_CARE_PROVIDER_SITE_OTHER)

## 2024-05-27 DIAGNOSIS — R0602 Shortness of breath: Secondary | ICD-10-CM

## 2024-05-27 DIAGNOSIS — R053 Chronic cough: Secondary | ICD-10-CM | POA: Diagnosis not present

## 2024-05-27 DIAGNOSIS — J189 Pneumonia, unspecified organism: Secondary | ICD-10-CM | POA: Diagnosis not present

## 2024-05-27 DIAGNOSIS — U071 COVID-19: Secondary | ICD-10-CM

## 2024-05-27 DIAGNOSIS — J4540 Moderate persistent asthma, uncomplicated: Secondary | ICD-10-CM

## 2024-05-27 MED ORDER — PAXLOVID (300/100) 20 X 150 MG & 10 X 100MG PO TBPK: 3.0000 | ORAL_TABLET | Freq: Two times a day (BID) | ORAL | 0 refills | Status: DC

## 2024-05-27 MED ORDER — PREDNISONE 10 MG (21) PO TBPK: ORAL_TABLET | Freq: Every day | ORAL | 0 refills | Status: AC

## 2024-05-27 MED ORDER — ALBUTEROL SULFATE HFA 108 (90 BASE) MCG/ACT IN AERS: 2.0000 | INHALATION_SPRAY | Freq: Four times a day (QID) | RESPIRATORY_TRACT | 5 refills | Status: AC | PRN

## 2024-05-27 MED ORDER — PAXLOVID (300/100) 20 X 150 MG & 10 X 100MG PO TBPK: 3.0000 | ORAL_TABLET | Freq: Two times a day (BID) | ORAL | 0 refills | Status: AC

## 2024-05-27 MED ORDER — DOXYCYCLINE HYCLATE 100 MG PO CAPS: 100.0000 mg | ORAL_CAPSULE | Freq: Two times a day (BID) | ORAL | 0 refills | Status: AC

## 2024-05-27 MED ORDER — AMOXICILLIN-POT CLAVULANATE 875-125 MG PO TABS: 1.0000 | ORAL_TABLET | Freq: Two times a day (BID) | ORAL | 0 refills | Status: AC

## 2024-05-27 NOTE — ED Provider Notes (Signed)
 [facility]    CSN: [id number] Arrival date & time: 05/27/24  1425      History   Chief Complaint Chief Complaint  Patient presents with   Covid Positive   Cough    HPI Kaitlyn Rodgers is a 48 y.o. female.   Patient presents today with a positive COVID test at home.  Patient has shortness of breath cough low-grade fever nasal congestion body aches for approximately 3 days.  Patient has a pulse ox at home and states that her oxygen level has gone down to 87% on room air and he concerns her.  Patient had pneumonia with COVID back in 2020 and is asking for a chest x-ray to make sure she does not have pneumonia at this time.  Has been taking DayQuil NyQuil and nasal spray over-the-counter prior to arrival with no relief.  Denies any chest pain at this time    Past Medical History:  Diagnosis Date   Abnormal vaginal bleeding    Anemia    Asthma    seasonal asthma-using inhaler daily at present   Complication of anesthesia    panic attack after breast biopsy 20 years ago   DM II (diabetes mellitus, type II), controlled (HCC)    metformin  500mg  qd   Fibroids    GERD (gastroesophageal reflux disease)    occasional indigestion-tums prn   Heart murmur    Rheumatoid arthritis [facility])     Patient Active Problem List   Diagnosis Date Noted   History of diabetes mellitus, type II 05/02/2019   SIRS (systemic inflammatory response syndrome) (HCC) 10/20/2018   Acute on chronic respiratory failure with hypoxia (HCC) 10/19/2018   Type 2 diabetes mellitus without complication 10/19/2018   Rheumatoid arthritis (HCC) 10/19/2018   Pneumonia due to COVID-19 virus 10/17/2018   DUB (dysfunctional uterine bleeding) 01/24/2011   Thickened endometrium 01/24/2011    Past Surgical History:  Procedure Laterality Date   ABDOMINAL HYSTERECTOMY     BIOPSY BREAST  age 39   benign    CYSTOSCOPY  10/13/2011   Procedure: CYSTOSCOPY;  Surgeon: Harland JAYSON Birkenhead, MD;  Location: [facility];  Service:  Gynecology;  Laterality: N/A;   TUBAL LIGATION      OB History     Gravida  5   Para  4   Term  4   Preterm      AB  1   Living  4      SAB      IAB  1   Ectopic      Multiple      Live Births               Home Medications    Prior to Admission medications   Medication Sig Start Date End Date Taking? Authorizing Provider  amoxicillin -clavulanate (AUGMENTIN ) 875-125 MG tablet Take 1 tablet by mouth every 12 (twelve) hours. 05/27/24  Yes Merilee Andrea CROME, NP  doxycycline  (VIBRAMYCIN ) 100 MG capsule Take 1 capsule (100 mg total) by mouth 2 (two) times daily. 05/27/24  Yes Merilee Andrea CROME, NP  predniSONE  (STERAPRED UNI-PAK 21 TAB) 10 MG (21) TBPK tablet Take by mouth daily. Take 6 tabs by mouth daily  for 2 days, then 5 tabs for 2 days, then 4 tabs for 2 days, then 3 tabs for 2 days, 2 tabs for 2 days, then 1 tab by mouth daily for 2 days 05/27/24  Yes Merilee Andrea CROME, NP  albuterol  (VENTOLIN  HFA) 108 (90 Base)  MCG/ACT inhaler Inhale 2 puffs into the lungs every 6 (six) hours as needed for wheezing or shortness of breath. 05/27/24   Merilee Andrea CROME, NP  dimenhyDRINATE (DRAMAMINE PO) Take by mouth as needed.    [provider]  Dulaglutide (TRULICITY [place]) Inject into the skin once a week. Patient not taking: Reported on 04/24/2021    [provider]  fluticasone  (FLONASE ) 50 MCG/ACT nasal spray Place 1 spray into both nostrils daily.    [provider]  hydroxychloroquine  (PLAQUENIL ) 200 MG tablet Take 1 tablet (200 mg total) by mouth 2 (two) times daily. Patient taking differently: Take 200 mg by mouth daily. 11/22/19   Dolphus Reiter, MD  Insulin  Glargine (BASAGLAR  KWIKPEN) 100 UNIT/ML SOPN Inject 0.2 mLs (20 Units total) into the skin 2 (two) times daily. Patient not taking: Reported on 04/24/2021 10/20/18   Odell Celinda Balo, MD  metFORMIN  (GLUCOPHAGE ) 500 MG tablet 500mg  by mouth in the morning and 1,000mg  by mouth at bedtime.     [provider]  mometasone-formoterol (DULERA ) 100-5 MCG/ACT AERO Inhale 2 puffs into the lungs in the morning and at bedtime. 09/13/19   Desai, Nikita S, MD  nirmatrelvir/ritonavir (PAXLOVID , 300/100,) 20 x 150 MG & 10 x 100MG  TBPK Take 3 tablets by mouth 2 (two) times daily for 5 days. Patient GFR is 105. Take nirmatrelvir (150 mg) two tablets twice daily for 5 days and ritonavir (100 mg) one tablet twice daily for 5 days. 05/27/24 06/01/24  Merilee Andrea CROME, NP  pantoprazole  (PROTONIX ) 40 MG tablet Take 1 tablet (40 mg total) by mouth daily. Patient not taking: Reported on 04/24/2021 09/13/19   Desai, Nikita S, MD  pravastatin (PRAVACHOL) 40 MG tablet Take 40 mg by mouth daily.    [provider]  Semaglutide (OZEMPIC, 0.25 OR 0.5 MG/DOSE, [place]) Inject into the skin once a week.    [provider]  TRULICITY 1.5 MG/0.5ML SOPN SMARTSIG:0.5 Milliliter(s) SUB-Q Once a Week Patient not taking: Reported on 04/24/2021 09/12/19   [provider]  budesonide-formoterol (SYMBICORT) 160-4.5 MCG/ACT inhaler Inhale 2 puffs into the lungs 2 (two) times daily.  09/13/19  [provider]    Family History Family History  Problem Relation Age of Onset   Heart disease Mother    Diabetes Maternal Grandmother    Prostate cancer Father    Healthy Sister    Healthy Brother    Healthy Brother    Healthy Son    Diabetes Daughter    Healthy Daughter    Asthma Daughter    Healthy Daughter     Social History Social History   Tobacco Use   Smoking status: Never   Smokeless tobacco: Never  Vaping Use   Vaping status: Never Used  Substance Use Topics   Alcohol use: No   Drug use: No     Allergies   Patient has no known allergies.   Review of Systems Review of Systems  Constitutional:  Positive for chills and fatigue.  HENT:  Positive for congestion, postnasal drip, rhinorrhea, sinus pressure and sinus pain.   Respiratory:  Positive for cough and  shortness of breath. Negative for wheezing.   Cardiovascular: Negative.   Gastrointestinal: Negative.   Genitourinary: Negative.   Neurological: Negative.      Physical Exam Triage Vital Signs ED Triage Vitals  Encounter Vitals Group     BP 05/27/24 1445 116/89     Girls Systolic BP Percentile --      Girls  Diastolic BP Percentile --      Boys Systolic BP Percentile --      Boys Diastolic BP Percentile --      Pulse Rate 05/27/24 1445 100     Resp 05/27/24 1445 20     Temp 05/27/24 1445 98.2 F (36.8 C)     Temp Source 05/27/24 1445 Oral     SpO2 05/27/24 1445 92 %     Weight 05/27/24 1444 (!) 302 lb (137 kg)     Height 05/27/24 1444 5' 4 (1.626 m)     Head Circumference --      Peak Flow --      Pain Score 05/27/24 1442 8     Pain Loc --      Pain Education --      Exclude from Growth Chart --    No data found.  Updated Vital Signs BP 116/89 (BP Location: Left Arm)   Pulse 100   Temp 98.2 F (36.8 C) (Oral)   Resp 20   Ht 5' 4 (1.626 m)   Wt (!) 302 lb (137 kg)   LMP 07/09/2011   SpO2 92%   BMI 51.84 kg/m   Visual Acuity Right Eye Distance:   Left Eye Distance:   Bilateral Distance:    Right Eye Near:   Left Eye Near:    Bilateral Near:     Physical Exam Constitutional:      Appearance: Normal appearance.  HENT:     Right Ear: Tympanic membrane normal.     Left Ear: Tympanic membrane normal.     Nose: Congestion present.  Eyes:     Extraocular Movements: Extraocular movements intact.     Pupils: Pupils are equal, round, and reactive to light.  Cardiovascular:     Rate and Rhythm: Normal rate.     Pulses: Normal pulses.  Pulmonary:     Effort: Pulmonary effort is normal.     Breath sounds: Normal breath sounds.  Abdominal:     General: Abdomen is flat. Bowel sounds are normal.  Skin:    General: Skin is warm.  Neurological:     General: No focal deficit present.     Mental Status: She is alert.      UC Treatments / Results   Labs (all labs ordered are listed, but only abnormal results are displayed) Labs Reviewed - No data to display  EKG   Radiology DG Chest 2 View Result Date: 05/27/2024 CLINICAL DATA:  Shortness of breath.  COVID positive.  Congestion. EXAM: CHEST - 2 VIEW COMPARISON:  09/07/2019 FINDINGS: The cardiomediastinal contours are normal. Mild bronchial thickening. Vague ill-defined retrocardiac opacity. Pulmonary vasculature is normal. No pleural effusion or pneumothorax. No acute osseous abnormalities are seen. IMPRESSION: 1. Vague ill-defined retrocardiac opacity, may be atelectasis or pneumonia. 2. Mild bronchial thickening. Electronically Signed   By: Andrea Gasman M.D.   On: 05/27/2024 15:21    Procedures Procedures (including critical care time)  Medications Ordered in UC Medications - No data to display  Initial Impression / Assessment and Plan / UC Course  I have reviewed the triage vital signs and the nursing notes.  Pertinent labs & imaging results that were available during my care of the patient were reviewed by me and considered in my medical decision making (see chart for details).     Will start on Paxlovid  GFR is within range patient states that she is not on a statin at this time.  Use albuterol   inhaler as needed for shortness of breath If you have increased shortness of breath chest pain you will need to be seen in the emergency room Take Tylenol  or Motrin  as needed Discussed that steroids will increase your sugar levels temporarily keep an eye on these Can take over-the-counter cough medicine   Final Clinical Impressions(s) / UC Diagnoses   Final diagnoses:  Chronic cough  COVID     Discharge Instructions      Use albuterol  inhaler as needed for shortness of breath If you have increased shortness of breath chest pain you will need to be seen in the emergency room Take Tylenol  or Motrin  as needed Discussed that steroids will increase your sugar levels  temporarily keep an eye on these Can take over-the-counter cough medicine     ED Prescriptions     Medication Sig Dispense Auth. Provider   albuterol  (VENTOLIN  HFA) 108 (90 Base) MCG/ACT inhaler Inhale 2 puffs into the lungs every 6 (six) hours as needed for wheezing or shortness of breath. 18 g Merilee Hollering L, NP   predniSONE  (STERAPRED UNI-PAK 21 TAB) 10 MG (21) TBPK tablet Take by mouth daily. Take 6 tabs by mouth daily  for 2 days, then 5 tabs for 2 days, then 4 tabs for 2 days, then 3 tabs for 2 days, 2 tabs for 2 days, then 1 tab by mouth daily for 2 days 42 tablet Merilee Hollering L, NP   nirmatrelvir/ritonavir (PAXLOVID , 300/100,) 20 x 150 MG & 10 x 100MG  TBPK  (Status: Discontinued) Take 3 tablets by mouth 2 (two) times daily for 5 days. Patient GFR is 105. Take nirmatrelvir (150 mg) two tablets twice daily for 5 days and ritonavir (100 mg) one tablet twice daily for 5 days. 30 tablet Merilee Hollering L, NP   nirmatrelvir/ritonavir (PAXLOVID , 300/100,) 20 x 150 MG & 10 x 100MG  TBPK  (Status: Discontinued) Take 3 tablets by mouth 2 (two) times daily for 5 days. Patient GFR is 105. Take nirmatrelvir (150 mg) two tablets twice daily for 5 days and ritonavir (100 mg) one tablet twice daily for 5 days. 30 tablet Merilee Hollering L, NP   nirmatrelvir/ritonavir (PAXLOVID , 300/100,) 20 x 150 MG & 10 x 100MG  TBPK Take 3 tablets by mouth 2 (two) times daily for 5 days. Patient GFR is 105. Take nirmatrelvir (150 mg) two tablets twice daily for 5 days and ritonavir (100 mg) one tablet twice daily for 5 days. 30 tablet Merilee Hollering L, NP   amoxicillin -clavulanate (AUGMENTIN ) 875-125 MG tablet Take 1 tablet by mouth every 12 (twelve) hours. 14 tablet Merilee Hollering L, NP   doxycycline  (VIBRAMYCIN ) 100 MG capsule Take 1 capsule (100 mg total) by mouth 2 (two) times daily. 20 capsule Merilee Hollering CROME, NP      PDMP not reviewed this encounter.   Merilee Hollering CROME, NP 05/27/24  [phone]

## 2024-05-27 NOTE — Discharge Instructions (Addendum)
 Use albuterol  inhaler as needed for shortness of breath If you have increased shortness of breath chest pain you will need to be seen in the emergency room Take Tylenol  or Motrin  as needed Discussed that steroids will increase your sugar levels temporarily keep an eye on these Can take over-the-counter cough medicine

## 2024-05-27 NOTE — ED Triage Notes (Signed)
 Patient tested positive for COVID today using a home COVID/flu test. Patient having congestion, runny nose, body aches, SOB, cough, and facial pressure onset 3 days ago. Patient has a history of asthma and diabetes.   Patient tried Dayquil, Nyquil, tylenol , ibuprofen , Vics, and saline nasal spray with little relief.
